# Patient Record
Sex: Male | Born: 1955 | Race: White | Hispanic: No | Marital: Married | Smoking: Never smoker
Health system: Southern US, Community
[De-identification: ages and names within clinical notes are randomized; demographics above are authoritative.]

## PROBLEM LIST (undated history)

## (undated) DIAGNOSIS — E291 Testicular hypofunction: Secondary | ICD-10-CM

## (undated) DIAGNOSIS — N529 Male erectile dysfunction, unspecified: Secondary | ICD-10-CM

## (undated) DIAGNOSIS — E785 Hyperlipidemia, unspecified: Secondary | ICD-10-CM

## (undated) DIAGNOSIS — E559 Vitamin D deficiency, unspecified: Secondary | ICD-10-CM

## (undated) DIAGNOSIS — J302 Other seasonal allergic rhinitis: Secondary | ICD-10-CM

## (undated) DIAGNOSIS — J4540 Moderate persistent asthma, uncomplicated: Secondary | ICD-10-CM

## (undated) DIAGNOSIS — G4733 Obstructive sleep apnea (adult) (pediatric): Secondary | ICD-10-CM

## (undated) DIAGNOSIS — K219 Gastro-esophageal reflux disease without esophagitis: Secondary | ICD-10-CM

## (undated) DIAGNOSIS — Z87438 Personal history of other diseases of male genital organs: Secondary | ICD-10-CM

## (undated) DIAGNOSIS — F32A Depression, unspecified: Secondary | ICD-10-CM

## (undated) DIAGNOSIS — M1990 Unspecified osteoarthritis, unspecified site: Secondary | ICD-10-CM

## (undated) DIAGNOSIS — M25511 Pain in right shoulder: Secondary | ICD-10-CM

## (undated) DIAGNOSIS — F329 Major depressive disorder, single episode, unspecified: Secondary | ICD-10-CM

## (undated) DIAGNOSIS — F411 Generalized anxiety disorder: Secondary | ICD-10-CM

## (undated) HISTORY — PX: KNEE ARTHROSCOPY: SUR90

## (undated) HISTORY — PX: ROTATOR CUFF REPAIR: SHX139

## (undated) HISTORY — PX: HEMORRHOID SURGERY: SHX153

## (undated) HISTORY — PX: CARDIAC CATHETERIZATION: SHX172

## (undated) HISTORY — PX: REPLACEMENT UNICONDYLAR JOINT KNEE: SUR1227

---
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
## 1983-09-26 HISTORY — PX: FINGER FRACTURE SURGERY: SHX638

## 2000-02-11 ENCOUNTER — Emergency Department (HOSPITAL_COMMUNITY): Admission: EM | Admit: 2000-02-11 | Discharge: 2000-02-11 | Payer: Self-pay | Admitting: Emergency Medicine

## 2000-02-11 ENCOUNTER — Encounter: Payer: Self-pay | Admitting: Emergency Medicine

## 2009-01-24 ENCOUNTER — Emergency Department (HOSPITAL_COMMUNITY): Admission: EM | Admit: 2009-01-24 | Discharge: 2009-01-25 | Payer: Self-pay | Admitting: Emergency Medicine

## 2009-09-25 DIAGNOSIS — Z87438 Personal history of other diseases of male genital organs: Secondary | ICD-10-CM

## 2009-09-25 HISTORY — DX: Personal history of other diseases of male genital organs: Z87.438

## 2010-10-18 LAB — URINALYSIS, ROUTINE W REFLEX MICROSCOPIC
Bilirubin Urine: NEGATIVE
Ketones, ur: NEGATIVE mg/dL
Nitrite: NEGATIVE
Protein, ur: NEGATIVE mg/dL
Urobilinogen, UA: 0.2 mg/dL (ref 0.0–1.0)
pH: 5.5 (ref 5.0–8.0)

## 2010-10-18 LAB — DIFFERENTIAL
Basophils Absolute: 0.1 K/uL (ref 0.0–0.1)
Basophils Relative: 1 % (ref 0–1)
Eosinophils Absolute: 0.1 K/uL (ref 0.0–0.7)
Eosinophils Relative: 2 % (ref 0–5)
Lymphocytes Relative: 24 % (ref 12–46)
Lymphs Abs: 1.8 K/uL (ref 0.7–4.0)
Monocytes Absolute: 0.4 K/uL (ref 0.1–1.0)
Monocytes Relative: 5 % (ref 3–12)
Neutro Abs: 5.4 K/uL (ref 1.7–7.7)
Neutrophils Relative %: 69 % (ref 43–77)

## 2010-10-18 LAB — BASIC METABOLIC PANEL WITH GFR
BUN: 11 mg/dL (ref 6–23)
CO2: 29 meq/L (ref 19–32)
Calcium: 9.6 mg/dL (ref 8.4–10.5)
Chloride: 102 meq/L (ref 96–112)
Creatinine, Ser: 1.09 mg/dL (ref 0.4–1.5)
GFR calc non Af Amer: >60 mL/min
Glucose, Bld: 122 mg/dL — ABNORMAL HIGH (ref 70–99)
Potassium: 3.6 meq/L (ref 3.5–5.1)
Sodium: 138 meq/L (ref 135–145)

## 2010-10-18 LAB — CBC
MCH: 31.1 pg (ref 26.0–34.0)
MCHC: 35.5 g/dL (ref 30.0–36.0)
MCV: 87.8 fL (ref 78.0–100.0)
Platelets: 256 10*3/uL (ref 150–400)
RDW: 12.3 % (ref 11.5–15.5)

## 2010-10-18 LAB — PROTIME-INR
INR: 0.96 (ref 0.00–1.49)
Prothrombin Time: 13 seconds (ref 11.6–15.2)

## 2010-10-18 LAB — APTT: aPTT: 36 s (ref 24–37)

## 2010-10-24 ENCOUNTER — Inpatient Hospital Stay (HOSPITAL_COMMUNITY)
Admission: RE | Admit: 2010-10-24 | Discharge: 2010-10-25 | Disposition: A | Discharge status: Home / Self Care | Payer: Self-pay | Source: Home / Self Care | Attending: Orthopedic Surgery | Admitting: Orthopedic Surgery

## 2010-10-24 LAB — TYPE AND SCREEN: ABO/RH(D): A NEG

## 2010-10-24 LAB — ABO/RH: ABO/RH(D): A NEG

## 2010-10-25 LAB — CBC
HCT: 35 % — ABNORMAL LOW (ref 39.0–52.0)
Hemoglobin: 11.9 g/dL — ABNORMAL LOW (ref 13.0–17.0)
MCH: 30.7 pg (ref 26.0–34.0)
MCHC: 34 g/dL (ref 30.0–36.0)
MCV: 90.2 fL (ref 78.0–100.0)
Platelets: 184 10*3/uL (ref 150–400)
RBC: 3.88 MIL/uL — ABNORMAL LOW (ref 4.22–5.81)
RDW: 12.5 % (ref 11.5–15.5)
WBC: 7.3 10*3/uL (ref 4.0–10.5)

## 2010-10-25 LAB — BASIC METABOLIC PANEL
BUN: 9 mg/dL (ref 6–23)
CO2: 28 mEq/L (ref 19–32)
Calcium: 8.2 mg/dL — ABNORMAL LOW (ref 8.4–10.5)
Chloride: 108 mEq/L (ref 96–112)
Creatinine, Ser: 1.15 mg/dL (ref 0.4–1.5)
GFR calc Af Amer: >60 mL/min (ref >60)
GFR calc non Af Amer: >60 mL/min (ref >60)
Glucose, Bld: 130 mg/dL — ABNORMAL HIGH (ref 70–99)
Potassium: 4 mEq/L (ref 3.5–5.1)
Sodium: 140 mEq/L (ref 135–145)

## 2010-10-27 NOTE — Op Note (Signed)
Eric Travis, Eric Travis                ACCOUNT NO.:  [id number]  MEDICAL RECORD NO.:  [id number]          PATIENT TYPE:  INP  LOCATION:  0009                         FACILITY:  [facility]  PHYSICIAN:  Eric Frankel. Charlann Travis, M.D.  DATE OF BIRTH:  May 10, 1956  DATE OF PROCEDURE:  10/24/2010 DATE OF DISCHARGE:                              OPERATIVE REPORT   PREOPERATIVE DIAGNOSIS:  Left knee medial compartment osteoarthritis.  POSTOPERATIVE DIAGNOSIS:  Left knee medial compartment osteoarthritis.  PROCEDURE:  Left knee unicompartmental knee replacement utilizing a Biomet Oxford partial knee, size large femur B tibial tray, size 5 insert to match the femur.  SURGEON:  Eric Frankel. Charlann Travis, M.D.  ASSISTANT:  Eric Shi. Webb Silversmith, RN  ANESTHESIA:  Spinal.  COMPLICATIONS:  None.  SPECIMENS:  None.  DRAINS:  One Hemovac.  TOURNIQUET TIME:  39 minutes at 250 mmHg.  INDICATIONS FOR THE PROCEDURE:  Eric Travis is 55 year old gentleman who presented to the office for evaluation of progressive left knee medial compartment degenerative changes that was failing conservative measures.  Unfortunately, due to time and socially from work standpoint, he had a hard time finding time to get surgery done; however, his knee pain progressed to the point where he decided he needed to take time to do this.  We discussed the risk of infection, DVT, component failure, need for revision surgery.  Discussed the chance of having to revise him to a total knee replacement at some point.  Risks and benefits were reviewed and consent was obtained for the benefit of pain relief.  PROCEDURE IN DETAIL:  The patient was brought to operative theater. Once adequate anesthesia, preoperative antibiotics and Ancef administered, the patient was positioned supine with tourniquet placed. The nonoperative leg was flexed over the end of the table with the leg holder.  The patient was positioned to take tension off his back to keep his  head in a neutral position.  The left lower extremity was then prepped and draped in a sterile fashion.  Time-out was performed identifying the patient, planned procedure and the extremity.  Leg was exsanguinated, tourniquet elevated to 250 mmHg.  Paramidline incision was made from the proximal pole of the patella to the tubercle.  Soft tissue planes created.  A partial median arthrotomy was then made following initial synovectomy, debridement and partial meniscectomy.  Attention was first directed to the tibia.  Using the extramedullary guide, initial resection was made. This gap between the tibial femoral space was tight still yet, so I removed 3 more millimeters of bone by dropping the jig down 1 hole.  At this point, the cut surface even though I was lateral along the tibial spine seemed to be best fit with a size B tibial tray, particularly removing the medial osteophytes.  At this point, I placed a three-feeler gauge.  The four-feeler gauge had appropriate tension at this point.  At this point, the femoral canal was opened with a drill and then the awl.  Intramedullary rod was passed.  With a three-feeler gauge in place, the posterior guide for the cutting block was positioned in the center portion of medial  femoral condyle.  The true drills were made. The posterior cutting block was then tamped into position and an oscillating saw used to make the posterior cut.  At this point, based on the flexion assessment, I used a 4 miller and milled down the distal femur.  Remaining bone was removed.  At this point, I was able to get a good look at the posterior aspect of the knee.  I was able to remove remaining meniscus.  At this point, the large femur was placed, B tibial tray.  At this point, I felt like I was going to be between 4 and 5 based on the tension in flexion and 20 degrees of extension.  Given these parameters, a trial femoral component was removed.  The tibial tray was  pinned into position.  The trough for the keel was made with a reciprocating saw first and then the osteotome.  Trial reduction was now carried out with the lollipop retractor, large femur and the keeled tibial tray.  Again, I was between 4 and 5 and was going to make this determination following cementing.  Trial components were removed.  The sclerotic bone was drilled with a holes.  Knee was irrigated and cement mixed.  Final component was opened.  Final components were cemented in a two-stage technique with the tibia component cemented first and trial femur placed.  The knee was brought to extension with a five-feeler gauge and the knee held at 45 degrees of flexion to allow for the cement to cure.  Following a minute and half, the femoral component was cemented into position and the knee held at 45 degrees of flexion with the five-feeler gauge in place until cement had cured.  While this was going on, the synovial capsule junction of the knee was injected with 0.25% Marcaine with epinephrine and 1 cc of Toradol, total of 31 cc.  Once the cement had cured and I was satisfied, I was unable to visualize any remaining cement in the knee.  I retrialed.  I ended up choosing the 5 insert based on the trial reduction lollipop retractors.  The 5 insert was chosen to match the large femur.  The insert was then snapped into place.  The knee was reirrigated.  Tourniquet had been let down at 39 minutes.  A medium Hemovac drain was placed deep.  The extensor mechanism was then reapproximated using #1 Vicryl with the knee in flexion.  The remainder of the wound was closed with 2-0 Vicryl and running 4-0 Monocryl.  The knee was cleaned, dried and dressed sterilely using Dermabond and Aquacel dressing.  The drain site was dressed separately.  He was then placed into a sterile Ace wrap, brought to recovery room in stable condition tolerating the procedure well.     Eric Travis,  M.D.     MDO/MEDQ  D:  10/24/2010  T:  10/24/2010  Job:  [id number]  Electronically Signed by Eric Travis M.D. on 10/27/2010 11:46:54 AM

## 2010-11-06 NOTE — Discharge Summary (Signed)
  NAMEABDUL, Eric Travis NO.:  [id number]  MEDICAL RECORD NO.:  [id number]          PATIENT TYPE:  INP  LOCATION:  1618                         FACILITY:  [facility]  PHYSICIAN:  Madlyn Frankel. Charlann Boxer, M.D.  DATE OF BIRTH:  1956-09-21  DATE OF ADMISSION:  10/24/2010 DATE OF DISCHARGE:                              DISCHARGE SUMMARY   BRIEF HISTORY:  The patient is seen for conservative treatment for left knee pain for some time through worker's compensation.  He had been treated by Dr. Thomasena Edis in our practice for other things and decided to proceed with left uni-knee arthroplasty with Dr. Charlann Boxer.  ADMITTING DIAGNOSIS:  Left knee medial compartment osteoarthritis.  DISCHARGE DIAGNOSIS:  Left knee medial compartment osteoarthritis.  He is also significant for some impaired hearing with surgical correction, he wears reading glasses.  He has history of hemorrhoids and left knee pain, some joint weakness and tinnitus from time to time.  PAST SURGICAL HISTORY:  Knee surgeries, shoulder surgeries he has had with Dr. Thomasena Edis.  He has had 1 hand surgery in the past and hemorrhoid surgery.  LABORATORY DATA:  His labs have been stable.  This morning his hematocrit and hemoglobin were 11.9 and 35.0.  Sodium was 140, his potassium was 4.0.  His BUN and creatinine were 9 and 1.15.  His blood sugar is 130.  He is afebrile.  His vital signs are stable.  CONDITION ON DISCHARGE:  His discharge condition is good.  He will follow up with Dr. Charlann Boxer in 2 weeks.  Discharge instructions were given.  He has Aquacel dressing in place.  He will have home health physical therapy for 2 weeks.  He will be able to shower with that dressing and it needs to come off in 8 days.  DISCHARGE MEDICATIONS:  Discharge medications are as follows: 1. Acetaminophen 325 mg 2 every 4 hours as needed. 2. Colace 100 mg twice daily as needed. 3. Ferrous sulfate 325 mg 3 times a day. 4. Hydrocodone 7.5/325 one to  two q.4-6 hours p.r.n. pain. 5. Robaxin 500 mg every 6 hours as needed. 6. Percocet 5/325 one to two every 4 to 6 hours as needed, not with     hydrocodone. 7. MiraLax 17 g a day as needed. 8. Xarelto 10 mg a day for 10 days. 9. Calcium and vitamin D. 10.Multivitamins daily.     Russell L. Webb Silversmith, RN   ______________________________ Madlyn Frankel Charlann Boxer, M.D.    RLW/MEDQ  D:  10/25/2010  T:  10/25/2010  Job:  [id number]  Electronically Signed by Lauree Chandler NP-C on 11/02/2010 09:42:21 AM Electronically Signed by Durene Romans M.D. on 11/06/2010 09:16:53 AM

## 2010-11-06 NOTE — H&P (Signed)
Eric Travis, SPEAKMAN                ACCOUNT NO.:  [id number]  MEDICAL RECORD NO.:  [id number]          PATIENT TYPE:  INP  LOCATION:  NA                           FACILITY:  [facility]  PHYSICIAN:  Madlyn Frankel. Charlann Boxer, M.D.  DATE OF BIRTH:  1956-07-18  DATE OF ADMISSION: DATE OF DISCHARGE:                             HISTORY & PHYSICAL   BRIEF HISTORY:  This is a patient who had been seen here conservatively for left knee pain for sometime.  He has been treated in this practice by Dr. Thomasena Edis for shoulder issues in the past and has decided to proceed with the left uni knee arthroplasty due to failed conservative treatment.  ADMITTING DIAGNOSIS:  Left knee medial compartment osteoarthritis.  PAST MEDICAL HISTORY:  Significant for an impaired hearing problem, this being surgically corrected in the near future.  He does wear reading glasses.  He has got history of hemorrhoids and he has a left knee pain and muscular weakness, joint pain.  He has some tinnitus from time to time.  MEDICATIONS:  The patient takes no medicines.  ALLERGIES:  Has no medicine allergies.  SURGICAL HISTORY:  Had three knee surgeries and ear surgery, 1 hand surgery,  6 shoulder surgeries, and hemorrhoid surgery.  SOCIAL HISTORY:  He is married.  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  Has no history of tobacco, alcohol, or street drug abuse.  He has 3 children.  DISCHARGE DISPOSITION PLAN:  Per home.  FAMILY HISTORY:  His father is 60.  His mother died at age 65.  Has no siblings.  REVIEW OF SYSTEMS:  Notable for those difficulties described in history of present illness, past medical history; however, his review of systems is otherwise unremarkable.  PHYSICAL EXAMINATION:  VITAL SIGNS:  The patient is 5 feet and 10 inches, 178 pounds, blood pressure is 118/80s, respirations 20s, pulse is 80. GENERAL:  Health is good. HEENT:  Shows him to be normocephalic.  He does have impaired hearing in the right ear.  He  wears reading glasses. NECK:  Unremarkable. CHEST:  Clear to auscultation bilaterally. HEART:  Has S1 and S2.  No murmurs, rubs, or gallops. ABDOMEN:  Soft, nondistended.  He does have a history of hemorrhoids. GU AND GI:  Otherwise unremarkable. EXTREMITIES:  Exam shows osteoarthritis of the medial compartment of the left knee with some swelling. DERMATOLOGICALLY:  He is intact. NEUROLOGICALLY:  He is intact.  LABORATORY DATA:  EKG and chest x-ray are pending to [facility].  IMPRESSION:  Left medial knee compartment, osteoarthritis.  PLAN:  He will be admitted on the January 30 for a left uni knee arthroplasty.  His questions were encouraged and answered.  His discharge medications including Robaxin, MiraLax, Colace, iron are relatively given to him today.  His pain medicine will be given to him at discharge.     Russell L. Webb Silversmith, RN   ______________________________ Madlyn Frankel Charlann Boxer, M.D.    RLW/MEDQ  D:  10/17/2010  T:  10/17/2010  Job:  [id number]  Electronically Signed by Lauree Chandler NP-C on 11/02/2010 09:42:24 AM Electronically Signed by Durene Romans M.D. on  11/06/2010 09:16:50 AM

## 2014-04-23 DIAGNOSIS — F4323 Adjustment disorder with mixed anxiety and depressed mood: Secondary | ICD-10-CM | POA: Insufficient documentation

## 2016-06-30 DIAGNOSIS — R0683 Snoring: Secondary | ICD-10-CM | POA: Insufficient documentation

## 2018-01-04 DIAGNOSIS — Z5181 Encounter for therapeutic drug level monitoring: Secondary | ICD-10-CM | POA: Insufficient documentation

## 2018-11-05 ENCOUNTER — Other Ambulatory Visit: Payer: Self-pay | Admitting: Specialist

## 2018-11-05 DIAGNOSIS — M75121 Complete rotator cuff tear or rupture of right shoulder, not specified as traumatic: Secondary | ICD-10-CM

## 2018-11-18 ENCOUNTER — Ambulatory Visit
Admission: RE | Admit: 2018-11-18 | Discharge: 2018-11-18 | Disposition: A | Discharge status: Home / Self Care | Payer: BLUE CROSS/BLUE SHIELD | Source: Ambulatory Visit | Attending: Specialist | Admitting: Specialist

## 2018-11-18 DIAGNOSIS — M75121 Complete rotator cuff tear or rupture of right shoulder, not specified as traumatic: Secondary | ICD-10-CM

## 2018-11-18 MED ORDER — IOPAMIDOL (ISOVUE-M 200) INJECTION 41%
13.0000 mL | Freq: Once | INTRAMUSCULAR | Status: AC
  Administered 2018-11-18: 13 mL via INTRA_ARTICULAR

## 2018-11-21 DIAGNOSIS — D751 Secondary polycythemia: Secondary | ICD-10-CM | POA: Insufficient documentation

## 2019-04-18 ENCOUNTER — Other Ambulatory Visit (HOSPITAL_COMMUNITY)
Admission: RE | Admit: 2019-04-18 | Discharge: 2019-04-18 | Disposition: A | Discharge status: Home / Self Care | Payer: BC Managed Care – PPO | Source: Ambulatory Visit | Attending: Specialist | Admitting: Specialist

## 2019-04-18 DIAGNOSIS — Z1159 Encounter for screening for other viral diseases: Secondary | ICD-10-CM | POA: Diagnosis present

## 2019-04-19 LAB — SARS CORONAVIRUS 2 (TAT 6-24 HRS): SARS Coronavirus 2: NEGATIVE

## 2019-04-21 ENCOUNTER — Encounter (HOSPITAL_BASED_OUTPATIENT_CLINIC_OR_DEPARTMENT_OTHER): Payer: Self-pay | Admitting: *Deleted

## 2019-04-21 ENCOUNTER — Other Ambulatory Visit: Payer: Self-pay

## 2019-04-21 DIAGNOSIS — M25511 Pain in right shoulder: Secondary | ICD-10-CM | POA: Insufficient documentation

## 2019-04-21 NOTE — Progress Notes (Signed)
Spoke w/ pt via phone for pre-op interview.  Npo after mn.  Arrive at 0530.  Pt had covid test done 04-18-2019.  Will take prilosec am dos w/ sips of water.  Asked pt to bring inhaler dos.

## 2019-04-22 ENCOUNTER — Ambulatory Visit (HOSPITAL_BASED_OUTPATIENT_CLINIC_OR_DEPARTMENT_OTHER)
Admission: RE | Admit: 2019-04-22 | Discharge: 2019-04-22 | Disposition: A | Discharge status: Home / Self Care | Payer: BC Managed Care – PPO | Attending: Specialist | Admitting: Specialist

## 2019-04-22 ENCOUNTER — Encounter (HOSPITAL_BASED_OUTPATIENT_CLINIC_OR_DEPARTMENT_OTHER)
Admission: RE | Disposition: A | Discharge status: Home / Self Care | Payer: Self-pay | Source: Home / Self Care | Attending: Specialist

## 2019-04-22 ENCOUNTER — Ambulatory Visit (HOSPITAL_BASED_OUTPATIENT_CLINIC_OR_DEPARTMENT_OTHER): Payer: BC Managed Care – PPO | Admitting: Anesthesiology

## 2019-04-22 ENCOUNTER — Other Ambulatory Visit: Payer: Self-pay

## 2019-04-22 ENCOUNTER — Encounter (HOSPITAL_BASED_OUTPATIENT_CLINIC_OR_DEPARTMENT_OTHER): Payer: Self-pay

## 2019-04-22 DIAGNOSIS — E291 Testicular hypofunction: Secondary | ICD-10-CM | POA: Insufficient documentation

## 2019-04-22 DIAGNOSIS — F411 Generalized anxiety disorder: Secondary | ICD-10-CM | POA: Diagnosis not present

## 2019-04-22 DIAGNOSIS — M67813 Other specified disorders of tendon, right shoulder: Secondary | ICD-10-CM | POA: Diagnosis not present

## 2019-04-22 DIAGNOSIS — J4540 Moderate persistent asthma, uncomplicated: Secondary | ICD-10-CM | POA: Insufficient documentation

## 2019-04-22 DIAGNOSIS — G4733 Obstructive sleep apnea (adult) (pediatric): Secondary | ICD-10-CM | POA: Diagnosis not present

## 2019-04-22 DIAGNOSIS — M75121 Complete rotator cuff tear or rupture of right shoulder, not specified as traumatic: Secondary | ICD-10-CM | POA: Diagnosis present

## 2019-04-22 DIAGNOSIS — Z79899 Other long term (current) drug therapy: Secondary | ICD-10-CM | POA: Insufficient documentation

## 2019-04-22 DIAGNOSIS — E559 Vitamin D deficiency, unspecified: Secondary | ICD-10-CM | POA: Insufficient documentation

## 2019-04-22 DIAGNOSIS — K219 Gastro-esophageal reflux disease without esophagitis: Secondary | ICD-10-CM | POA: Insufficient documentation

## 2019-04-22 DIAGNOSIS — Z96653 Presence of artificial knee joint, bilateral: Secondary | ICD-10-CM | POA: Insufficient documentation

## 2019-04-22 DIAGNOSIS — F329 Major depressive disorder, single episode, unspecified: Secondary | ICD-10-CM | POA: Insufficient documentation

## 2019-04-22 HISTORY — DX: Hyperlipidemia, unspecified: E78.5

## 2019-04-22 HISTORY — DX: Obstructive sleep apnea (adult) (pediatric): G47.33

## 2019-04-22 HISTORY — DX: Male erectile dysfunction, unspecified: N52.9

## 2019-04-22 HISTORY — DX: Vitamin D deficiency, unspecified: E55.9

## 2019-04-22 HISTORY — DX: Depression, unspecified: F32.A

## 2019-04-22 HISTORY — DX: Gastro-esophageal reflux disease without esophagitis: K21.9

## 2019-04-22 HISTORY — DX: Other seasonal allergic rhinitis: J30.2

## 2019-04-22 HISTORY — DX: Testicular hypofunction: E29.1

## 2019-04-22 HISTORY — DX: Pain in right shoulder: M25.511

## 2019-04-22 HISTORY — DX: Generalized anxiety disorder: F41.1

## 2019-04-22 HISTORY — DX: Moderate persistent asthma, uncomplicated: J45.40

## 2019-04-22 HISTORY — PX: SHOULDER ARTHROSCOPY WITH ROTATOR CUFF REPAIR: SHX5685

## 2019-04-22 SURGERY — ARTHROSCOPY, SHOULDER, WITH ROTATOR CUFF REPAIR
Anesthesia: General | Site: Shoulder | Laterality: Right

## 2019-04-22 MED ORDER — PROPOFOL 10 MG/ML IV BOLUS
INTRAVENOUS | Status: DC | PRN
  Administered 2019-04-22: 180 mg via INTRAVENOUS

## 2019-04-22 MED ORDER — BUPIVACAINE-EPINEPHRINE (PF) 0.5% -1:200000 IJ SOLN
INTRAMUSCULAR | Status: DC | PRN
  Administered 2019-04-22: 15 mL via PERINEURAL

## 2019-04-22 MED ORDER — DEXAMETHASONE SODIUM PHOSPHATE 10 MG/ML IJ SOLN
INTRAMUSCULAR | Status: AC
  Filled 2019-04-22: qty 1

## 2019-04-22 MED ORDER — SODIUM CHLORIDE 0.9 % IR SOLN
Status: DC | PRN
  Administered 2019-04-22: 6000 mL

## 2019-04-22 MED ORDER — FENTANYL CITRATE (PF) 100 MCG/2ML IJ SOLN
INTRAMUSCULAR | Status: DC | PRN
  Administered 2019-04-22: 50 ug via INTRAVENOUS

## 2019-04-22 MED ORDER — FENTANYL CITRATE (PF) 100 MCG/2ML IJ SOLN
INTRAMUSCULAR | Status: AC
  Filled 2019-04-22: qty 2

## 2019-04-22 MED ORDER — MIDAZOLAM HCL 2 MG/2ML IJ SOLN
INTRAMUSCULAR | Status: AC
  Filled 2019-04-22: qty 2

## 2019-04-22 MED ORDER — EPINEPHRINE PF 1 MG/ML IJ SOLN
INTRAMUSCULAR | Status: DC | PRN
  Administered 2019-04-22: .625 mg

## 2019-04-22 MED ORDER — POVIDONE-IODINE 7.5 % EX SOLN
Freq: Once | CUTANEOUS | Status: DC
  Filled 2019-04-22: qty 118

## 2019-04-22 MED ORDER — LIDOCAINE HCL (CARDIAC) PF 100 MG/5ML IV SOSY
PREFILLED_SYRINGE | INTRAVENOUS | Status: DC | PRN
  Administered 2019-04-22: 80 mg via INTRAVENOUS

## 2019-04-22 MED ORDER — OXYCODONE HCL 5 MG PO TABS
5.0000 mg | ORAL_TABLET | Freq: Once | ORAL | Status: DC | PRN
  Filled 2019-04-22: qty 1

## 2019-04-22 MED ORDER — FENTANYL CITRATE (PF) 250 MCG/5ML IJ SOLN
INTRAMUSCULAR | Status: AC
  Filled 2019-04-22: qty 5

## 2019-04-22 MED ORDER — ONDANSETRON HCL 4 MG/2ML IJ SOLN
INTRAMUSCULAR | Status: AC
  Filled 2019-04-22: qty 2

## 2019-04-22 MED ORDER — ONDANSETRON HCL 4 MG/2ML IJ SOLN
INTRAMUSCULAR | Status: DC | PRN
  Administered 2019-04-22: 4 mg via INTRAVENOUS

## 2019-04-22 MED ORDER — DEXAMETHASONE SODIUM PHOSPHATE 4 MG/ML IJ SOLN
INTRAMUSCULAR | Status: DC | PRN
  Administered 2019-04-22: 5 mg via INTRAVENOUS

## 2019-04-22 MED ORDER — MIDAZOLAM HCL 2 MG/2ML IJ SOLN
2.0000 mg | Freq: Once | INTRAMUSCULAR | Status: AC
  Administered 2019-04-22: 2 mg via INTRAVENOUS
  Filled 2019-04-22: qty 2

## 2019-04-22 MED ORDER — CEFAZOLIN SODIUM-DEXTROSE 2-4 GM/100ML-% IV SOLN
INTRAVENOUS | Status: AC
  Filled 2019-04-22: qty 100

## 2019-04-22 MED ORDER — PHENYLEPHRINE 40 MCG/ML (10ML) SYRINGE FOR IV PUSH (FOR BLOOD PRESSURE SUPPORT)
PREFILLED_SYRINGE | INTRAVENOUS | Status: AC
  Filled 2019-04-22: qty 10

## 2019-04-22 MED ORDER — EPHEDRINE 5 MG/ML INJ
INTRAVENOUS | Status: AC
  Filled 2019-04-22: qty 10

## 2019-04-22 MED ORDER — LIDOCAINE 2% (20 MG/ML) 5 ML SYRINGE
INTRAMUSCULAR | Status: AC
  Filled 2019-04-22: qty 5

## 2019-04-22 MED ORDER — FENTANYL CITRATE (PF) 100 MCG/2ML IJ SOLN
25.0000 ug | INTRAMUSCULAR | Status: DC | PRN
  Filled 2019-04-22: qty 1

## 2019-04-22 MED ORDER — EPHEDRINE SULFATE 50 MG/ML IJ SOLN
INTRAMUSCULAR | Status: DC | PRN
  Administered 2019-04-22 (×3): 10 mg via INTRAVENOUS

## 2019-04-22 MED ORDER — ROCURONIUM BROMIDE 10 MG/ML (PF) SYRINGE
PREFILLED_SYRINGE | INTRAVENOUS | Status: AC
  Filled 2019-04-22: qty 10

## 2019-04-22 MED ORDER — ONDANSETRON HCL 4 MG/2ML IJ SOLN
4.0000 mg | Freq: Four times a day (QID) | INTRAMUSCULAR | Status: DC | PRN
  Filled 2019-04-22: qty 2

## 2019-04-22 MED ORDER — SODIUM CHLORIDE (PF) 0.9 % IJ SOLN
INTRAMUSCULAR | Status: DC | PRN
  Administered 2019-04-22: 4.375 mL

## 2019-04-22 MED ORDER — MIDAZOLAM HCL 5 MG/5ML IJ SOLN
INTRAMUSCULAR | Status: DC | PRN
  Administered 2019-04-22: 1 mg via INTRAVENOUS

## 2019-04-22 MED ORDER — FENTANYL CITRATE (PF) 100 MCG/2ML IJ SOLN
50.0000 ug | Freq: Once | INTRAMUSCULAR | Status: AC
  Administered 2019-04-22: 07:00:00 50 ug via INTRAVENOUS
  Filled 2019-04-22: qty 1

## 2019-04-22 MED ORDER — BUPIVACAINE LIPOSOME 1.3 % IJ SUSP
INTRAMUSCULAR | Status: DC | PRN
  Administered 2019-04-22: 10 mL via PERINEURAL

## 2019-04-22 MED ORDER — LACTATED RINGERS IV SOLN
INTRAVENOUS | Status: DC
  Administered 2019-04-22: 09:00:00 via INTRAVENOUS
  Administered 2019-04-22: 50 mL/h via INTRAVENOUS
  Filled 2019-04-22: qty 1000

## 2019-04-22 MED ORDER — SUCCINYLCHOLINE CHLORIDE 20 MG/ML IJ SOLN
INTRAMUSCULAR | Status: DC | PRN
  Administered 2019-04-22: 120 mg via INTRAVENOUS

## 2019-04-22 MED ORDER — PHENYLEPHRINE HCL (PRESSORS) 10 MG/ML IV SOLN
INTRAVENOUS | Status: DC | PRN
  Administered 2019-04-22 (×2): 80 ug via INTRAVENOUS

## 2019-04-22 MED ORDER — PROPOFOL 10 MG/ML IV BOLUS
INTRAVENOUS | Status: AC
  Filled 2019-04-22: qty 20

## 2019-04-22 MED ORDER — OXYCODONE HCL 5 MG/5ML PO SOLN
5.0000 mg | Freq: Once | ORAL | Status: DC | PRN
  Filled 2019-04-22: qty 5

## 2019-04-22 MED ORDER — CEFAZOLIN SODIUM-DEXTROSE 2-4 GM/100ML-% IV SOLN
2.0000 g | INTRAVENOUS | Status: AC
  Administered 2019-04-22: 2 g via INTRAVENOUS
  Filled 2019-04-22: qty 100

## 2019-04-22 SURGICAL SUPPLY — 87 items
ANCH SUT SWLK 19.1X4.75 VT (Anchor) ×2 IMPLANT
ANCHOR PEEK 4.75X19.1 SWLK C (Anchor) ×4 IMPLANT
BLADE EXCALIBUR 4.0MM X 13CM (MISCELLANEOUS) ×1
BLADE EXCALIBUR 4.0X13 (MISCELLANEOUS) ×2 IMPLANT
BLADE EXCALIBUR 5.0X13 (MISCELLANEOUS) ×1 IMPLANT
BLADE SURG 11 STRL SS (BLADE) ×3 IMPLANT
BLADE SURG 15 STRL LF DISP TIS (BLADE) ×1 IMPLANT
BLADE SURG 15 STRL SS (BLADE) ×3
BURR CLEARCUT OVAL 5.5X13 (MISCELLANEOUS) ×1 IMPLANT
BURR OVAL 12 FL 5.5MM X 13CM (MISCELLANEOUS) ×1
BURR OVAL 12 FL 5.5X13 (MISCELLANEOUS) ×1
BURR OVAL 8 FLU 5.0MM X 13CM (MISCELLANEOUS) ×1
BURR OVAL 8 FLU 5.0X13 (MISCELLANEOUS) ×2 IMPLANT
CANNULA 5.75X7 CRYSTAL CLEAR (CANNULA) ×2 IMPLANT
CANNULA 5.75X71 LONG (CANNULA) IMPLANT
CANNULA TWIST IN 8.25X7CM (CANNULA) ×2 IMPLANT
CONNECTOR 5 IN 1 STRAIGHT STRL (MISCELLANEOUS) ×3 IMPLANT
COVER WAND RF STERILE (DRAPES) ×3 IMPLANT
DECANTER SPIKE VIAL GLASS SM (MISCELLANEOUS) ×3 IMPLANT
DRAPE ORTHO SPLIT 77X108 STRL (DRAPES) ×6
DRAPE POUCH INSTRU U-SHP 10X18 (DRAPES) ×3 IMPLANT
DRAPE SHEET LG 3/4 BI-LAMINATE (DRAPES) ×3 IMPLANT
DRAPE STERI 35X30 U-POUCH (DRAPES) ×3 IMPLANT
DRAPE SURG 17X23 STRL (DRAPES) ×3 IMPLANT
DRAPE SURG ORHT 6 SPLT 77X108 (DRAPES) ×2 IMPLANT
DRAPE U-SHAPE 47X51 STRL (DRAPES) ×3 IMPLANT
DURAPREP 26ML APPLICATOR (WOUND CARE) ×5 IMPLANT
DW OUTFLOW CASSETTE/TUBE SET (MISCELLANEOUS) ×3 IMPLANT
ELECT MENISCUS 165MM 90D (ELECTRODE) IMPLANT
ELECT REM PT RETURN 9FT ADLT (ELECTROSURGICAL) ×3
ELECTRODE REM PT RTRN 9FT ADLT (ELECTROSURGICAL) ×1 IMPLANT
EXCALIBUR 3.8MM X 13CM (MISCELLANEOUS) ×3 IMPLANT
FIBER TAPE 2MM (SUTURE) ×3 IMPLANT
FIBERSTICK 2 (SUTURE) IMPLANT
GAUZE SPONGE 4X4 12PLY STRL (GAUZE/BANDAGES/DRESSINGS) ×3 IMPLANT
GAUZE XEROFORM 1X8 LF (GAUZE/BANDAGES/DRESSINGS) ×3 IMPLANT
GLOVE BIO SURGEON STRL SZ 6.5 (GLOVE) ×1 IMPLANT
GLOVE BIO SURGEON STRL SZ8 (GLOVE) ×3 IMPLANT
GLOVE BIO SURGEONS STRL SZ 6.5 (GLOVE) ×1
GLOVE INDICATOR 8.0 STRL GRN (GLOVE) ×3 IMPLANT
GOWN STRL REUS W/ TWL LRG LVL3 (GOWN DISPOSABLE) IMPLANT
GOWN STRL REUS W/TWL LRG LVL3 (GOWN DISPOSABLE) ×9 IMPLANT
GOWN STRL REUS W/TWL XL LVL3 (GOWN DISPOSABLE) ×6 IMPLANT
KIT TURNOVER CYSTO (KITS) ×3 IMPLANT
LASSO SUT 90 DEGREE (SUTURE) IMPLANT
MANIFOLD NEPTUNE II (INSTRUMENTS) ×3 IMPLANT
NDL 1/2 CIR CATGUT .05X1.09 (NEEDLE) IMPLANT
NDL SCORPION MULTI FIRE (NEEDLE) IMPLANT
NEEDLE 1/2 CIR CATGUT .05X1.09 (NEEDLE) IMPLANT
NEEDLE HYPO 22GX1.5 SAFETY (NEEDLE) ×3 IMPLANT
NEEDLE SCORPION MULTI FIRE (NEEDLE) ×3 IMPLANT
NS IRRIG 500ML POUR BTL (IV SOLUTION) IMPLANT
PACK ARTHROSCOPY DSU (CUSTOM PROCEDURE TRAY) ×3 IMPLANT
PACK BASIN DAY SURGERY FS (CUSTOM PROCEDURE TRAY) ×3 IMPLANT
PAD ABD 8X10 STRL (GAUZE/BANDAGES/DRESSINGS) ×3 IMPLANT
PAD ARMBOARD 7.5X6 YLW CONV (MISCELLANEOUS) IMPLANT
PENCIL BUTTON HOLSTER BLD 10FT (ELECTRODE) IMPLANT
PORT APPOLLO RF 90DEGREE MULTI (SURGICAL WAND) ×3 IMPLANT
SLEEVE ARM SUSPENSION SYSTEM (MISCELLANEOUS) ×3 IMPLANT
SLING S3 LATERAL DISP (MISCELLANEOUS) IMPLANT
SLING ULTRA II L (ORTHOPEDIC SUPPLIES) ×3 IMPLANT
SLING ULTRA II S (ORTHOPEDIC SUPPLIES) ×2 IMPLANT
SPONGE LAP 4X18 RFD (DISPOSABLE) IMPLANT
SUT 2 FIBERLOOP 20 STRT BLUE (SUTURE)
SUT ETHILON 3 0 PS 1 (SUTURE) ×3 IMPLANT
SUT FIBERWIRE #2 38 T-5 BLUE (SUTURE)
SUT LASSO 45 DEGREE LEFT (SUTURE) IMPLANT
SUT LASSO 45D RIGHT (SUTURE) IMPLANT
SUT PDS AB 0 CT1 36 (SUTURE) IMPLANT
SUT TIGER TAPE 7 IN WHITE (SUTURE) ×2 IMPLANT
SUT VIC AB 0 CT1 36 (SUTURE) IMPLANT
SUT VIC AB 2-0 CT1 27 (SUTURE)
SUT VIC AB 2-0 CT1 TAPERPNT 27 (SUTURE) IMPLANT
SUTURE 2 FIBERLOOP 20 STRT BLU (SUTURE) IMPLANT
SUTURE FIBERWR #2 38 T-5 BLUE (SUTURE) IMPLANT
SUTURE TAPE 1.3 40 TPR END (SUTURE) ×2 IMPLANT
SUTURETAPE 1.3 40 TPR END (SUTURE) ×9
SYR CONTROL 10ML LL (SYRINGE) ×3 IMPLANT
SYR TB 1ML 27GX1/2 SAFE (SYRINGE) ×1 IMPLANT
SYR TB 1ML 27GX1/2 SAFETY (SYRINGE) ×3
TAPE FIBER 2MM 7IN #2 BLUE (SUTURE) IMPLANT
TOWEL OR 17X26 10 PK STRL BLUE (TOWEL DISPOSABLE) ×3 IMPLANT
TUBE CONNECTING 12'X1/4 (SUCTIONS) ×1
TUBE CONNECTING 12X1/4 (SUCTIONS) ×2 IMPLANT
TUBING ARTHROSCOPY IRRIG 16FT (MISCELLANEOUS) ×3 IMPLANT
TUBING REDEUCE PUMP W/CON 8IN (MISCELLANEOUS) ×3 IMPLANT
WATER STERILE IRR 500ML POUR (IV SOLUTION) ×3 IMPLANT

## 2019-04-22 NOTE — Transfer of Care (Signed)
Immediate Anesthesia Transfer of Care Note  Patient: Eric Travis  Procedure(s) Performed: SHOULDER ARTHROSCOPY WITH ROTATOR CUFF REPAIR AND ACROMIOPLASTY (Right Shoulder)  Patient Location: PACU  Anesthesia Type:General  Level of Consciousness: awake, alert  and oriented  Airway & Oxygen Therapy: Patient Spontanous Breathing and Patient connected to nasal cannula oxygen  Post-op Assessment: Report given to RN and Post -op Vital signs reviewed and stable  Post vital signs: Reviewed and stable  Last Vitals:  Vitals Value Taken Time  BP 123/75 04/22/19 0945  Temp 36.3 C 04/22/19 0929  Pulse 94 04/22/19 0946  Resp 18 04/22/19 0946  SpO2 93 % 04/22/19 0946  Vitals shown include unvalidated device data.  Last Pain:  Vitals:   04/22/19 0929  TempSrc:   PainSc: 0-No pain      Patients Stated Pain Goal: 3 (17/49/44 9675)  Complications: No apparent anesthesia complications

## 2019-04-22 NOTE — Anesthesia Procedure Notes (Signed)
Anesthesia Regional Block: Interscalene brachial plexus block   Pre-Anesthetic Checklist: ,, timeout performed, Correct Patient, Correct Site, Correct Laterality, Correct Procedure, Correct Position, site marked, Risks and benefits discussed,  Surgical consent,  Pre-op evaluation,  At surgeon's request and post-op pain management  Laterality: Right  Prep: chloraprep       Needles:  Injection technique: Single-shot  Needle Type: Echogenic Stimulator Needle     Needle Length: 5cm  Needle Gauge: 22     Additional Needles:   Procedures:, nerve stimulator,,,,,,,   Nerve Stimulator or Paresthesia:  Response: biceps flexion, 0.45 mA,   Additional Responses:   Narrative:  Start time: 04/22/2019 7:11 AM End time: 04/22/2019 7:18 AM Injection made incrementally with aspirations every 5 mL.  Performed by: Personally  Anesthesiologist: Albertha Ghee, MD  Additional Notes: Functioning IV was confirmed and monitors were applied.  A 38mm 22ga Arrow echogenic stimulator needle was used. Sterile prep and drape,hand hygiene and sterile gloves were used.  Negative aspiration and negative test dose prior to incremental administration of local anesthetic. The patient tolerated the procedure well.  Ultrasound guidance: relevent anatomy identified, needle position confirmed, local anesthetic spread visualized around nerve(s), vascular puncture avoided.  Image printed for medical record.

## 2019-04-22 NOTE — H&P (Signed)
Eric Travis is an 63 y.o. male.   Chief Complaint: My right shoulder hurts HPI: 63 year old male well-known to me with previous right shoulder surgery increasing right shoulder pain has been treated conservatively and has not provided significant relief.  MRI scan reveals recurrent rotator cuff tear desires surgical reconstruction.  Past Medical History:  Diagnosis Date  . Chronic depression   . Dyslipidemia   . ED (erectile dysfunction)   . GAD (generalized anxiety disorder)   . GERD (gastroesophageal reflux disease)   . History of acute prostatitis 2011  . Hypogonadism male   . Moderate persistent asthma    pulmonologist-  dr Shela Commonsj. Janee Mornthompson [facility] [place]  . OSA (obstructive sleep apnea)    per pt cpap intolerant  . Right shoulder pain   . Seasonal allergic rhinitis   . Vitamin D deficiency     Past Surgical History:  Procedure Laterality Date  . CARDIAC CATHETERIZATION  05-31-2016   @[facility]    normal coronary arteries,  normal EDP  . FINGER FRACTURE SURGERY  1985   pinning right little finger  . HEMORRHOID SURGERY    . KNEE ARTHROSCOPY    . REPLACEMENT UNICONDYLAR JOINT KNEE Bilateral right 2016 approx;  left 10-24-2010  dr Charlann Boxerolin @[facility]   . ROTATOR CUFF REPAIR  x4  left;  x3  right    History reviewed. No pertinent family history. Social History:  reports that he has never smoked. He has never used smokeless tobacco. He reports current alcohol use. He reports that he does not use drugs.  Allergies: No Known Allergies  Medications Prior to Admission  Medication Sig Dispense Refill  . ALPRAZolam (XANAX) 0.5 MG tablet Take 0.5 mg by mouth at bedtime as needed for anxiety.    . Ascorbic Acid (VITA-C PO) Take by mouth as needed.    . ergocalciferol (VITAMIN D2) 1.25 MG (50000 UT) capsule Take 50,000 Units by mouth once a week.    Marland Kitchen. ibuprofen (ADVIL) 200 MG tablet Take 800 mg by mouth every 6 (six) hours as needed.    . Methylcobalamin (B12-ACTIVE PO) Take by mouth as needed.     . montelukast (SINGULAIR) 10 MG tablet Take 10 mg by mouth at bedtime.     Marland Kitchen. omeprazole (PRILOSEC) 20 MG capsule Take 20 mg by mouth as needed.    . Testosterone Cypionate 200 MG/ML SOLN Inject 0.5 mLs as directed once a week.    . traZODone (DESYREL) 100 MG tablet Take 100 mg by mouth at bedtime.    . Albuterol Sulfate, sensor, (PROAIR DIGIHALER) 108 (90 Base) MCG/ACT AEPB Inhale into the lungs as needed. ProAir     . budesonide-formoterol (SYMBICORT) 160-4.5 MCG/ACT inhaler Inhale 2 puffs into the lungs as needed.    . fluticasone furoate-vilanterol (BREO ELLIPTA) 200-25 MCG/INH AEPB Inhale 1 puff into the lungs daily as needed.       No results found for this or any previous visit (from the past 48 hour(s)). No results found.  Review of Systems  Constitutional: Negative.   All other systems reviewed and are negative.   Blood pressure 118/72, pulse 76, temperature 97.8 F (36.6 C), temperature source Oral, resp. rate 12, height 5\' 10"  (1.778 m), weight 86.8 kg, SpO2 96 %. Physical Exam  Constitutional: He is oriented to person, place, and time. He appears well-developed and well-nourished.  HENT:  Head: Normocephalic and atraumatic.  Eyes: Pupils are equal, round, and reactive to light. Conjunctivae and EOM are normal.  Neck: Normal range of motion. Neck supple.  Cardiovascular: Normal rate, regular rhythm and normal heart sounds.  Respiratory: Breath sounds normal.  GI: Soft. Bowel sounds are normal.  Musculoskeletal:     Right shoulder: He exhibits decreased range of motion, tenderness, bony tenderness, pain and decreased strength.  Neurological: He is alert and oriented to person, place, and time. He has normal reflexes.  Skin: Skin is warm.  Psychiatric: He has a normal mood and affect. His behavior is normal. Judgment and thought content normal.     Assessment/Plan 63 year old male with recurrent right shoulder rotator cuff tear symptomatic despite conservative  treatment.  Treatment options were discussed in office he opted for surgical intervention.  Plan will be right shoulder arthroscopic evaluation with revision rotator cuff repair and debridement intra-articular as necessary he understands risk and benefits wishes to proceed. Cynda Familia, MD 04/22/2019, 7:44 AM

## 2019-04-22 NOTE — Op Note (Signed)
[id number]

## 2019-04-22 NOTE — Anesthesia Procedure Notes (Signed)
Procedure Name: Intubation Date/Time: 04/22/2019 8:00 AM Performed by: Bufford Spikes, CRNA Pre-anesthesia Checklist: Patient identified, Emergency Drugs available, Suction available and Patient being monitored Patient Re-evaluated:Patient Re-evaluated prior to induction Oxygen Delivery Method: Circle system utilized Preoxygenation: Pre-oxygenation with 100% oxygen Induction Type: IV induction Ventilation: Mask ventilation without difficulty Laryngoscope Size: Miller and 2 Grade View: Grade II Tube type: Oral Tube size: 7.0 mm Number of attempts: 1 Airway Equipment and Method: Stylet and Oral airway Placement Confirmation: ETT inserted through vocal cords under direct vision,  positive ETCO2 and breath sounds checked- equal and bilateral Secured at: 22 cm Tube secured with: Tape Dental Injury: Teeth and Oropharynx as per pre-operative assessment

## 2019-04-22 NOTE — Progress Notes (Signed)
Assisted Dr. Hodierne with right, ultrasound guided, interscalene  block. Side rails up, monitors on throughout procedure. See vital signs in flow sheet. Tolerated Procedure well. 

## 2019-04-22 NOTE — Op Note (Signed)
NAME: Eric Travis, Doyal G. MEDICAL RECORD [id number] ACCOUNT [id number] DATE OF BIRTH:January 01, 1956 FACILITY: [facility] LOCATION: [facility] PHYSICIAN:Bellarae Lizer Jim DesanctisA. Clatie Kessen, MD  OPERATIVE REPORT  DATE OF PROCEDURE:  04/22/2019  PREOPERATIVE DIAGNOSES: 1.  Right shoulder possible recurrent rotator cuff tear. 2.  Possible intraarticular internal derangement.  POSTOPERATIVE DIAGNOSES: 1.  Right shoulder recurrent medium-sized full-thickness rotator cuff tear. 2.  Intraarticular large biceps tendon stump.  PROCEDURE: 1.  Right shoulder arthroscopy with rotator cuff repair. 2.  Debridement of biceps tendon stump. 3.  Arthroscopic subacromial decompression, acromioplasty, bursectomy, CA ligament release.  SURGEON:  Erasmo Leventhalobert Andrew Kiyana Vazguez, MD  ASSISTANT:  Gigi GinSteven Chabon, PA-C  ANESTHESIA:  Interscalene block general.  ESTIMATED BLOOD LOSS:  Minimal.  DRAINS:  None.  COMPLICATIONS:  None.  DISPOSITION:  PACU, stable.  DESCRIPTION OF PROCEDURE:  The patient counseled in the holding area, correct site was identified, marked and signed appropriately.  IV started.  Sedation was given.  Block was administered.  Taken to the operating room.  IV antibiotics were given.  He  was placed in supine position.  General anesthesia.  Gently placed into a left lateral decubitus position, properly padded and a bump.  PAS stocking applied.  DVT prophylaxis.  Right shoulder examined for range of motion, stable.  It was prepped with  DuraPrep and draped into a sterile fashion.  Time-out done confirming the right side.  We utilized the Arthrex sterile shoulder holder at 30 degrees of abduction, 10 degrees forward flexion, and 15 pounds longitudinal traction and made sure they did not  over distract his shoulder, arm.  Posterior portal was created, and a scope was placed in the glenohumeral joint.  Immediately identifiable was intact articular cartilage and labrum, but there was a large biceps tendon stump superiorly  which was  impinging the joint.  He also had a full-thickness medial side of the rotator cuff tear, and the supraspinatus was identified.  There was some rotator cuff tendinopathy.  Lateral portal was established.  Neurovascular structures were protected, and a  shaver was introduced.  The biceps tendon stump was debrided back to healthy tissue and smoothed down nicely.  The debrided rotator cuff back to healthy tissue.  A subacromial-subdeltoid bursectomy was performed.  The cautery was utilized to take down  the soft tissue from the greater tuberosity and a bougie was utilized to increase the ____ with a light tuberoplasty.  Cautery was utilized to take down the CA ligament in the periosteum.   A bur was then placed posteriorly.  Utilizing the cutting-block  technique, an anterior, inferior, lateral acromioplasty was performed converting to a flat acromial morphology.  AC joint was asymptomatic and distal clavicle was left alone.  Following this, a small puncture was made superiorly.  An Arthrex PEEK anchor  was placed in the greater tuberosity.  Four limbs of suture were placed through the rotator cuff, all FiberTapes.  The arm was then abducted ____ a SwiveLock for suture bridge double-row technique.  At this point in time, an excellent repair of the  rotator cuff down to bleeding bone.  There were no other abnormalities noted.  Endoscope clip was removed.  Portals were closed with #1 suture.  Sterile dressing applied.  Shoulder placed in a shoulder abduction sling, turned supine, awakened and taken  from the operating room to PACU in stable condition.  He will be stabilized in the PACU and discharged home.  Follow up in the office in 2 weeks.  Helped with patient positioning, prepping, draping, technical,  and surgical assistance throughout the entire case, wound closure, application of dressing and sling, Mr.  Narda Amber, PA-C's assistance was needed.  LN/NUANCE  D:04/22/2019 T:04/22/2019  JOB:[id number]

## 2019-04-22 NOTE — Discharge Instructions (Signed)
°Post Anesthesia Home Care Instructions ° °Activity: °Get plenty of rest for the remainder of the day. A responsible individual must stay with you for 24 hours following the procedure.  °For the next 24 hours, DO NOT: °-Drive a car °-Operate machinery °-Drink alcoholic beverages °-Take any medication unless instructed by your physician °-Make any legal decisions or sign important papers. ° °Meals: °Start with liquid foods such as gelatin or soup. Progress to regular foods as tolerated. Avoid greasy, spicy, heavy foods. If nausea and/or vomiting occur, drink only clear liquids until the nausea and/or vomiting subsides. Call your physician if vomiting continues. ° °Special Instructions/Symptoms: °Your throat may feel dry or sore from the anesthesia or the breathing tube placed in your throat during surgery. If this causes discomfort, gargle with warm salt water. The discomfort should disappear within 24 hours. ° °If you had a scopolamine patch placed behind your ear for the management of post- operative nausea and/or vomiting: ° °1. The medication in the patch is effective for 72 hours, after which it should be removed.  Wrap patch in a tissue and discard in the trash. Wash hands thoroughly with soap and water. °2. You may remove the patch earlier than 72 hours if you experience unpleasant side effects which may include dry mouth, dizziness or visual disturbances. °3. Avoid touching the patch. Wash your hands with soap and water after contact with the patch. °   ° ° °Information for Discharge Teaching: °EXPAREL (bupivacaine liposome injectable suspension)  ° °Your surgeon or anesthesiologist gave you EXPAREL(bupivacaine) to help control your pain after surgery.  °· EXPAREL is a local anesthetic that provides pain relief by numbing the tissue around the surgical site. °· EXPAREL is designed to release pain medication over time and can control pain for up to 72 hours. °· Depending on how you respond to EXPAREL, you may  require less pain medication during your recovery. ° °Possible side effects: °· Temporary loss of sensation or ability to move in the area where bupivacaine was injected. °· Nausea, vomiting, constipation °· Rarely, numbness and tingling in your mouth or lips, lightheadedness, or anxiety may occur. °· Call your doctor right away if you think you may be experiencing any of these sensations, or if you have other questions regarding possible side effects. ° °Follow all other discharge instructions given to you by your surgeon or nurse. Eat a healthy diet and drink plenty of water or other fluids. ° °If you return to the hospital for any reason within 96 hours following the administration of EXPAREL, it is important for health care providers to know that you have received this anesthetic. A teal colored band has been placed on your arm with the date, time and amount of EXPAREL you have received in order to alert and inform your health care providers. Please leave this armband in place for the full 96 hours following administration, and then you may remove the band. ° ° °Regional Anesthesia Blocks ° °1. Numbness or the inability to move the "blocked" extremity may last from 3-48 hours after placement. The length of time depends on the medication injected and your individual response to the medication. If the numbness is not going away after 48 hours, call your surgeon. ° °2. The extremity that is blocked will need to be protected until the numbness is gone and the  Strength has returned. Because you cannot feel it, you will need to take extra care to avoid injury. Because it may be weak, you   may have difficulty moving it or using it. You may not know what position it is in without looking at it while the block is in effect. ° °3. For blocks in the legs and feet, returning to weight bearing and walking needs to be done carefully. You will need to wait until the numbness is entirely gone and the strength has returned. You  should be able to move your leg and foot normally before you try and bear weight or walk. You will need someone to be with you when you first try to ensure you do not fall and possibly risk injury. ° °4. Bruising and tenderness at the needle site are common side effects and will resolve in a few days. ° °5. Persistent numbness or new problems with movement should be communicated to the surgeon or the [facility] ([phone])/ [facility] ([phone]). °

## 2019-04-22 NOTE — Anesthesia Preprocedure Evaluation (Signed)
Anesthesia Evaluation  Patient identified by MRN, date of birth, ID band Patient awake    Reviewed: Allergy & Precautions, H&P , NPO status , Patient's Chart, lab work & pertinent test results  Airway Mallampati: II   Neck ROM: full    Dental   Pulmonary asthma , sleep apnea ,    breath sounds clear to auscultation       Cardiovascular negative cardio ROS   Rhythm:regular Rate:Normal     Neuro/Psych PSYCHIATRIC DISORDERS Anxiety Depression    GI/Hepatic GERD  ,  Endo/Other    Renal/GU      Musculoskeletal   Abdominal   Peds  Hematology   Anesthesia Other Findings   Reproductive/Obstetrics                             Anesthesia Physical Anesthesia Plan  ASA: II  Anesthesia Plan: General   Post-op Pain Management:  Regional for Post-op pain   Induction: Intravenous  PONV Risk Score and Plan: 2 and Ondansetron, Dexamethasone, Midazolam and Treatment may vary due to age or medical condition  Airway Management Planned: Oral ETT  Additional Equipment:   Intra-op Plan:   Post-operative Plan: Extubation in OR  Informed Consent: I have reviewed the patients History and Physical, chart, labs and discussed the procedure including the risks, benefits and alternatives for the proposed anesthesia with the patient or authorized representative who has indicated his/her understanding and acceptance.       Plan Discussed with: CRNA, Anesthesiologist and Surgeon  Anesthesia Plan Comments:         Anesthesia Quick Evaluation

## 2019-04-23 ENCOUNTER — Encounter (HOSPITAL_BASED_OUTPATIENT_CLINIC_OR_DEPARTMENT_OTHER): Payer: Self-pay | Admitting: Specialist

## 2019-04-23 NOTE — Anesthesia Postprocedure Evaluation (Signed)
Anesthesia Post Note  Patient: Eric Travis  Procedure(s) Performed: SHOULDER ARTHROSCOPY WITH ROTATOR CUFF REPAIR AND ACROMIOPLASTY (Right Shoulder)     Patient location during evaluation: PACU Anesthesia Type: General Level of consciousness: awake and alert Pain management: pain level controlled Vital Signs Assessment: post-procedure vital signs reviewed and stable Respiratory status: spontaneous breathing, nonlabored ventilation, respiratory function stable and patient connected to nasal cannula oxygen Cardiovascular status: blood pressure returned to baseline and stable Postop Assessment: no apparent nausea or vomiting Anesthetic complications: no    Last Vitals:  Vitals:   04/22/19 1015 04/22/19 1115  BP: 133/71 129/76  Pulse: 88 95  Resp: 15 16  Temp:  36.6 C  SpO2: 96% 95%    Last Pain:  Vitals:   04/23/19 1233  TempSrc:   PainSc: [place]

## 2019-11-17 DIAGNOSIS — S46219A Strain of muscle, fascia and tendon of other parts of biceps, unspecified arm, initial encounter: Secondary | ICD-10-CM | POA: Insufficient documentation

## 2019-11-17 DIAGNOSIS — M75101 Unspecified rotator cuff tear or rupture of right shoulder, not specified as traumatic: Secondary | ICD-10-CM | POA: Insufficient documentation

## 2019-11-24 DIAGNOSIS — Z4789 Encounter for other orthopedic aftercare: Secondary | ICD-10-CM | POA: Insufficient documentation

## 2020-02-11 DIAGNOSIS — M12812 Other specific arthropathies, not elsewhere classified, left shoulder: Secondary | ICD-10-CM | POA: Insufficient documentation

## 2020-04-13 IMAGING — MR MR SHOULDER*R* W/CM
6 series · 40 of 40 positions shown · IV contrast (agent unspecified)
Comparison: Limited arthrogram images from [DATE] [DATE]

CLINICAL DATA: Right shoulder pain weakness for 3 years. Multiple
prior surgeries.

EXAM:
MR ARTHROGRAM OF THE right SHOULDER
TECHNIQUE: Multiplanar, multisequence MR imaging of the right shoulder was
performed following the administration of intra-articular contrast.
CONTRAST:  See Injection Documentation.

[Series 3: T1 fat-sat · axial · 4.0mm · 0.27mm/px · z∈[-59,+44]mm · 8 of 22 slices shown (1 of 4)]
[im 1/22]
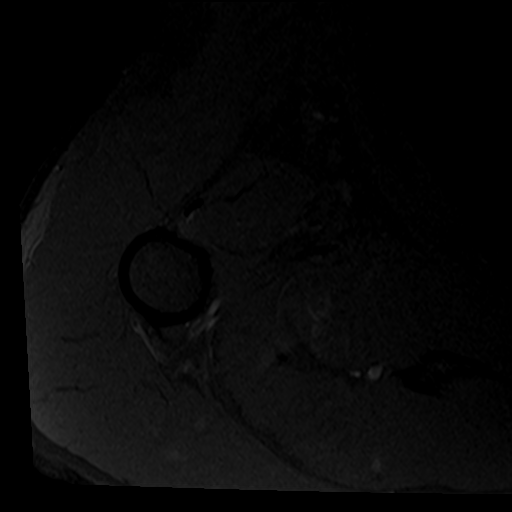
[im 4/22]
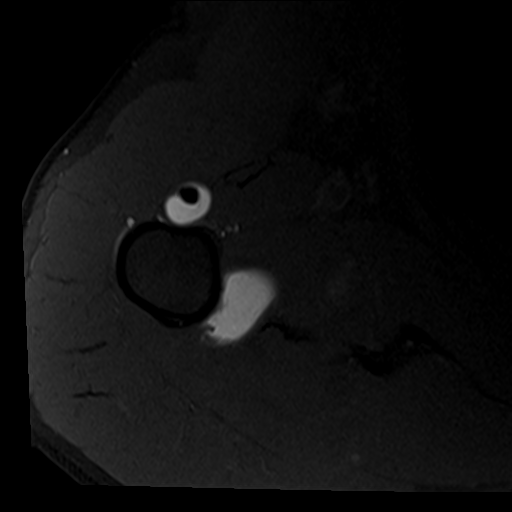
[im 7/22]
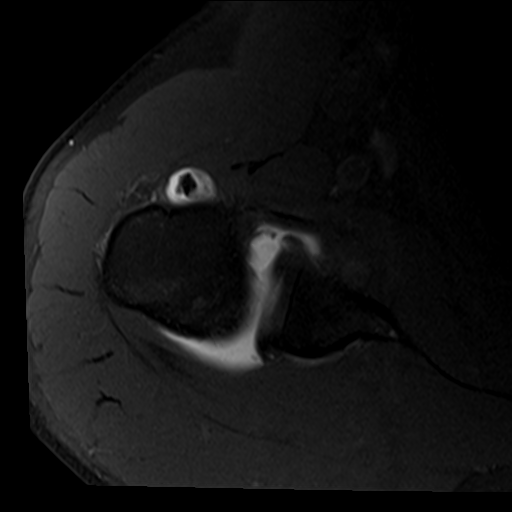
[im 10/22]
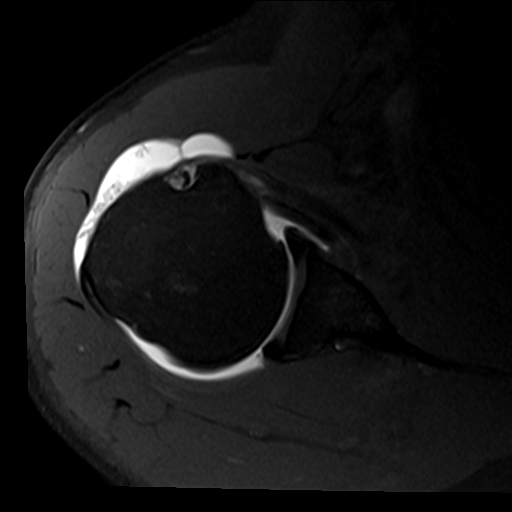
[im 13/22]
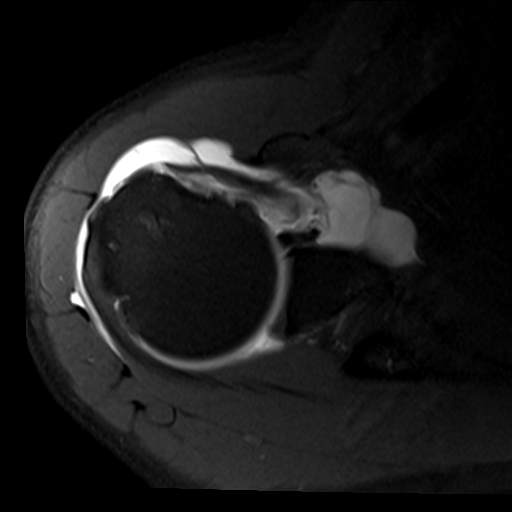
[im 16/22]
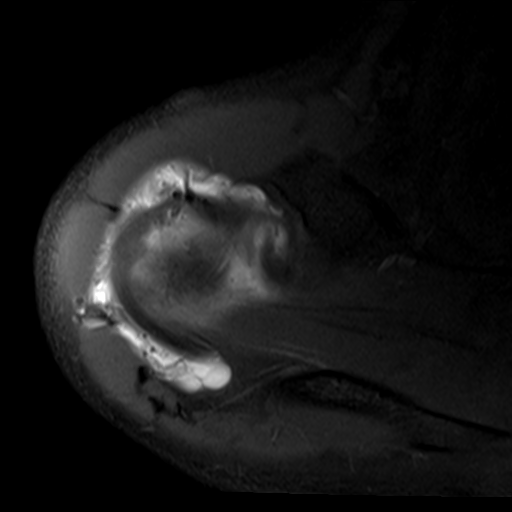
[im 19/22]
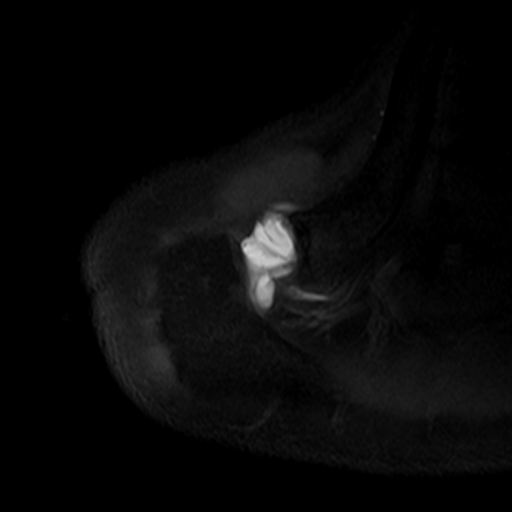
[im 22/22]
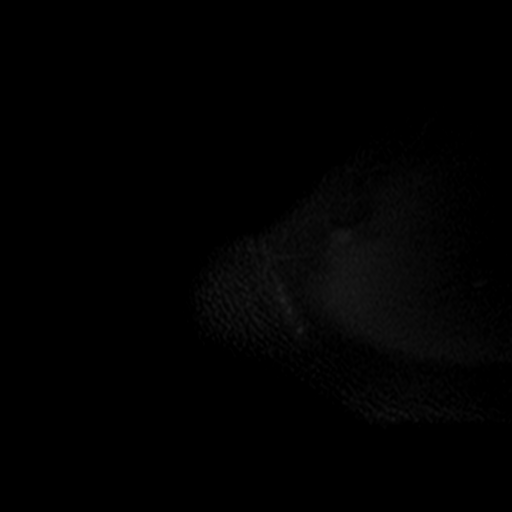

[Series 4: T2 fat-sat · coronal · 4.0mm · 0.55mm/px · 8 of 21 slices shown (1 of 2)]
[im 1/21]
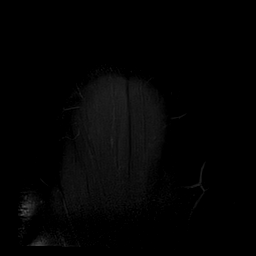
[im 3/21]
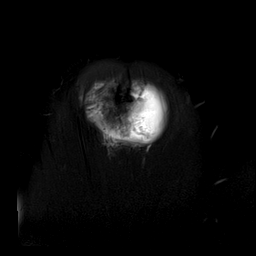
[im 6/21]
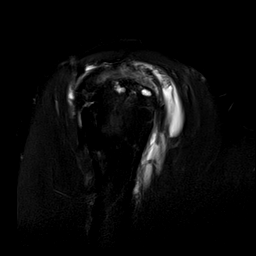
[im 9/21]
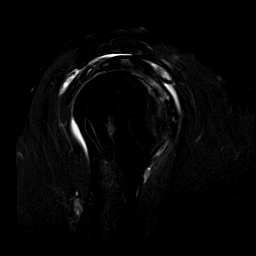
[im 12/21]
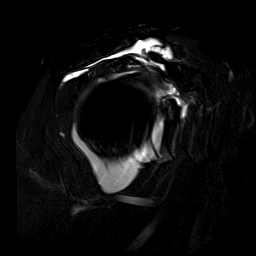
[im 15/21]
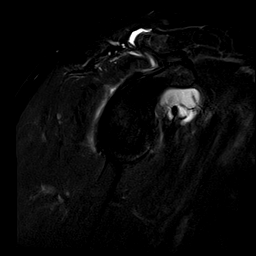
[im 18/21]
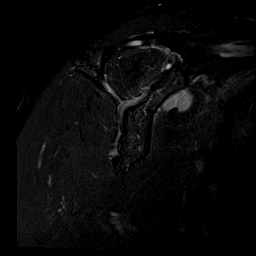
[im 21/21]
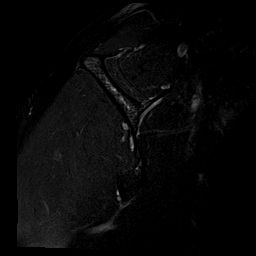

[Series 5: T1 fat-sat · oblique · 4.0mm · 0.55mm/px · 6 of 19 slices shown (2 of 4)]
[im 1/19]
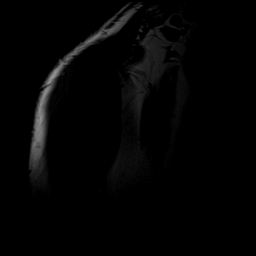
[im 4/19]
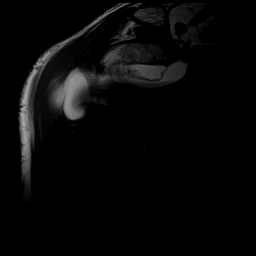
[im 8/19]
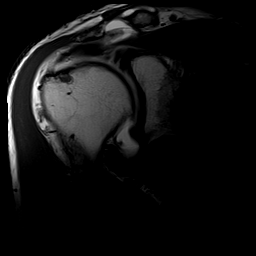
[im 11/19]
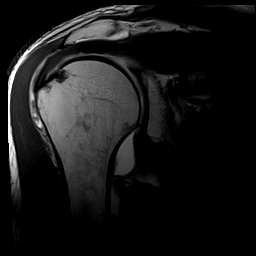
[im 15/19]
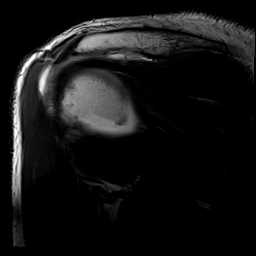
[im 19/19]
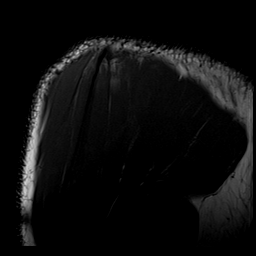

[Series 6: T1 fat-sat · oblique · 4.0mm · 0.55mm/px · 6 of 19 slices shown (3 of 4)]
[im 1/19]
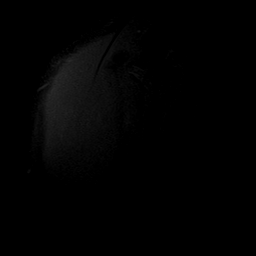
[im 4/19]
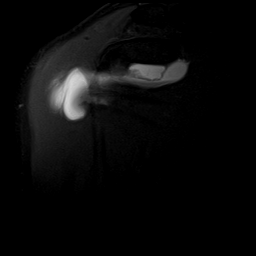
[im 8/19]
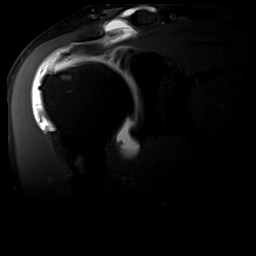
[im 11/19]
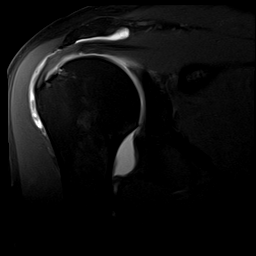
[im 15/19]
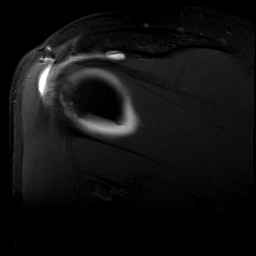
[im 19/19]
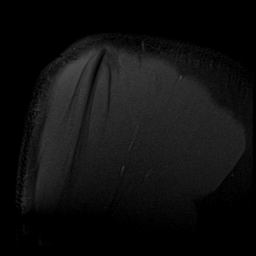

[Series 7: T2 fat-sat · oblique · 4.0mm · 0.55mm/px · 6 of 19 slices shown (2 of 2)]
[im 1/19]
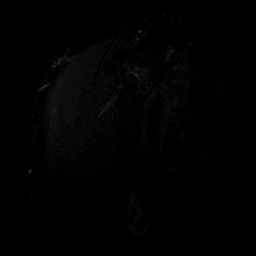
[im 4/19]
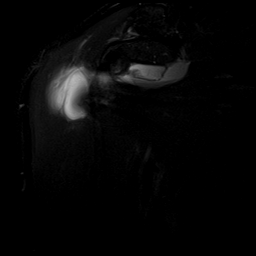
[im 8/19]
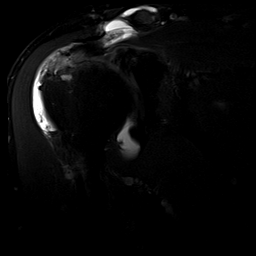
[im 11/19]
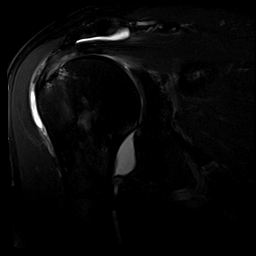
[im 15/19]
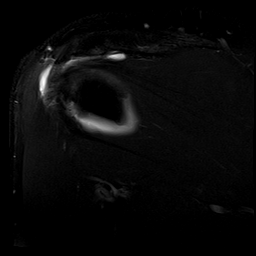
[im 19/19]
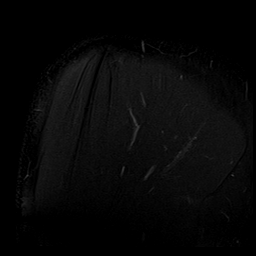

[Series 10: T1 fat-sat · sagittal · 4.0mm · 0.59mm/px · 6 of 18 slices shown (4 of 4)]
[im 1/18]
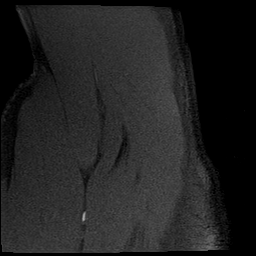
[im 4/18]
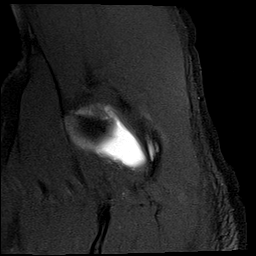
[im 7/18]
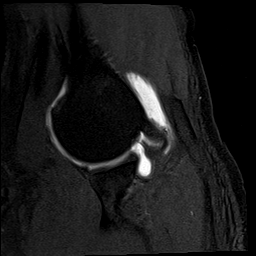
[im 11/18]
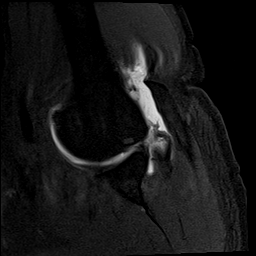
[im 14/18]
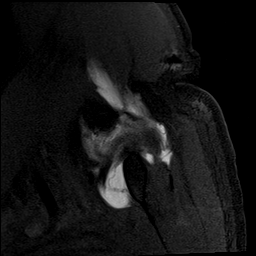
[im 18/18]
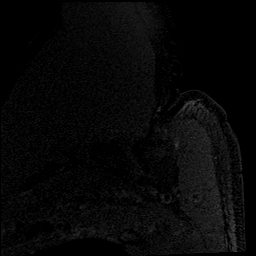

[40 of 40 positions shown; findings below may reference images not displayed]

FINDINGS: Rotator cuff: Full-thickness partial width tear of the distal
anterior supraspinatus tendon with severe supraspinatus and moderate
infraspinatus and subscapularis tendinopathy.

Muscles: Unremarkable

Biceps long head: Poor definition of the intra-articular portion of
the long head, likely from tearing, correlate with operative history
to exclude biceps tenodesis or tenotomy.

Acromioclavicular Joint: Postoperative findings with about 1.5 cm
separation between the distal clavicle and the acromion and prior
acromioplasty. Contrast medium from the joint extends into the
subacromial subdeltoid bursa and into the space between the acromion
and clavicle, and cephalad to the distal clavicle. Subacromial
morphology is type 2 (curved). There is some evidence of debris or
synovitis in the subacromial subdeltoid bursa which is distended
with contrast as shown on image [DATE].

Glenohumeral Joint: Moderate degenerative chondral thinning. Mild
spurring of the humeral head. Small degenerative subcortical cysts
present along the lateral anatomic neck. Well distended axillary
pouch. There is a thickened bandlike structure along the anterior
capsular margin on images 11-13 of series 3, I am uncertain whether
this is a thickened AWOK or a torn portion of the biceps tendon

Labrum: Mild fraying of the inferior labrum on image [DATE] without a
well-defined tear.

Bones: No additional significant bony findings.
IMPRESSION: 1. Full-thickness partial width tear of the distal anterior
supraspinatus tendon with intra-articular contrast medium continuous
with the subacromial subdeltoid bursa, and also extending up between
the acromion and the partially resected distal clavicle.
2. Prominent supraspinatus and moderate infraspinatus and
subscapularis tendinopathy.
3. Poor definition of the intra-articular portion of the long head
of the biceps, likely from tearing, correlate with operative history
to exclude biceps tenodesis or tenotomy as cause.
4. Small amount of debris or synovitis in the subacromial subdeltoid
bursa.
5. Moderate degenerative chondral thinning in the glenohumeral
joint.
6. Somewhat thickened/bandlike structure along the anterior capsular
margin of the joint is probably from a thickened AWOK.
7. Mild fraying of the inferior labrum without a well-defined labral
tear.

## 2020-08-12 DIAGNOSIS — M19012 Primary osteoarthritis, left shoulder: Secondary | ICD-10-CM | POA: Insufficient documentation

## 2020-08-13 ENCOUNTER — Other Ambulatory Visit: Payer: Self-pay | Admitting: Orthopedic Surgery

## 2020-08-13 DIAGNOSIS — M19012 Primary osteoarthritis, left shoulder: Secondary | ICD-10-CM

## 2020-09-01 DIAGNOSIS — G4733 Obstructive sleep apnea (adult) (pediatric): Secondary | ICD-10-CM | POA: Insufficient documentation

## 2020-10-08 NOTE — Pre-Procedure Instructions (Addendum)
[id number] Vilinda Boehringer,  - [address] AT [facility] [address] [place] [address] Phone: [phone] Fax: [phone]     Your procedure is scheduled on Tuesday, 10/12/20, from 12:30 PM- 2:52 PM.  Report to [facility] Main Entrance "A" at 10:30 A.M., and check in at the Admitting office.  Call this number if you have problems the morning of surgery:  [phone]  Call [phone] if you have any questions prior to your surgery date Monday-Friday 8am-4pm.    Remember:  Do not eat after midnight the night before your surgery.  You may drink clear liquids until 09:30 AM the morning of your surgery.   Clear liquids allowed are: Water, Non-Citrus Juices (without pulp), Carbonated Beverages, Clear Tea, Black Coffee Only, and Gatorade.   Enhanced Recovery after Surgery for Orthopedics Enhanced Recovery after Surgery is a protocol used to improve the stress on your body and your recovery after surgery.  Patient Instructions  . The night before surgery:  o No food after midnight. ONLY clear liquids after midnight  .  Marland Kitchen The day of surgery (if you do NOT have diabetes):  o Drink ONE (1) Pre-Surgery Clear Ensure by 09:30 AM the morning of surgery.   o This drink was given to you during your hospital pre-op appointment visit. o Nothing else to drink after completing the Pre-Surgery Clear Ensure.         If you have questions, please contact your surgeon's office.     Take these medicines the morning of surgery with A SIP OF WATER: ezetimibe (ZETIA) omeprazole (PRILOSEC)   As of today, STOP taking any Aspirin (unless otherwise instructed by your surgeon) Aleve, Naproxen, Ibuprofen, Motrin, Advil, Goody's, BC's, all herbal medications, fish oil, and all vitamins.                      Do not wear jewelry.            Do not wear lotions, powders, colognes, or deodorant.            Men may shave face and neck.            Do not bring  valuables to the hospital.            [facility] is not responsible for any belongings or valuables.  Do NOT Smoke (Tobacco/Vaping) or drink Alcohol 24 hours prior to your procedure. If you use a CPAP at night, you may bring all equipment for your overnight stay.   Contacts, glasses, dentures or bridgework may not be worn into surgery.      For patients admitted to the hospital, discharge time will be determined by your treatment team.   Patients discharged the day of surgery will not be allowed to drive home, and someone needs to stay with them for 24 hours.                                    [place]- Preparing for Total Shoulder Arthroplasty   Before surgery, you can play an important role. Because skin is not sterile, your skin needs to be as free of germs as possible. You can reduce the number of germs on your skin by using the following products. . Benzoyl Peroxide Gel o Reduces the number of germs present on the skin o Applied twice a day to shoulder  area starting two days before surgery   . Chlorhexidine Gluconate (CHG) Soap o An antiseptic cleaner that kills germs and bonds with the skin to continue killing germs even after washing o Used for showering the night before surgery and morning of surgery   Oral Hygiene is also important to reduce your risk of infection.                                    Remember - BRUSH YOUR TEETH THE MORNING OF SURGERY WITH YOUR REGULAR TOOTHPASTE  ==================================================================  Please follow these instructions carefully:  BENZOYL PEROXIDE 5% GEL  Please do not use if you have an allergy to benzoyl peroxide.   If your skin becomes reddened/irritated stop using the benzoyl peroxide.  Starting two days before surgery, apply as follows: 1. Apply benzoyl peroxide in the morning and at night. Apply after taking a shower. If you are not taking a shower clean entire shoulder front, back, and side along with  the armpit with a clean wet washcloth.  2. Place a quarter-sized dollop on your shoulder and rub in thoroughly, making sure to cover the front, back, and side of your shoulder, along with the armpit.   2 days before ____ AM   ____ PM              1 day before ____ AM   ____ PM                            3.  Do this twice a day for two days.  (Last application is the night before surgery, AFTER using the CHG soap as described below).  4. Do NOT apply benzoyl peroxide gel on the day of surgery.  CHLORHEXIDINE GLUCONATE (CHG) SOAP  Please do not use if you have an allergy to CHG or antibacterial soaps. If your skin becomes reddened/irritated stop using the CHG.   Do not shave (including legs and underarms) for at least 48 hours prior to first CHG shower. It is OK to shave your face.  Starting the night before surgery, use CHG soap as follows:  1. Shower the NIGHT BEFORE SURGERY and MORNING OF SURGERY with CHG.  2. If you choose to wash your hair, wash your hair first as usual with your normal shampoo.  3. After shampooing, rinse your hair and body thoroughly to remove the shampoo.  4. Use CHG as you would any other liquid soap.  You can apply CHG directly to the skin and wash gently with a scrungie or a clean washcloth.  5. Apply the CHG soap to your body ONLY FROM THE NECK DOWN.  Do not use on open wounds or open sores.  Avoid contact with your eyes, ears, mouth, and genitals (private parts).  Wash face and genitals (private parts) with your normal soap.  6. Wash thoroughly, paying special attention to the area where your surgery will be performed.  7. Thoroughly rinse your body with warm water from the neck down.  8. DO NOT shower/wash with your normal soap after using and rinsing off the CHG soap.   9. Pat yourself dry with a CLEAN TOWEL.   10.  Apply benzoyl peroxide.   11. Wear CLEAN PAJAMAS to bed the night before surgery; wear comfortable clothes the morning of  surgery.  12. Place CLEAN SHEETS on your bed the  night of your first shower and DO NOT SLEEP WITH PETS.  Day of Surgery: Shower as above Do not apply any deodorants/lotions.  Please wear clean clothes to the hospital/surgery center.   Remember to brush your teeth WITH YOUR REGULAR TOOTHPASTE.      Please read over the following fact sheets that you were given.

## 2020-10-11 ENCOUNTER — Encounter (HOSPITAL_COMMUNITY)
Admission: RE | Admit: 2020-10-11 | Discharge: 2020-10-11 | Disposition: A | Discharge status: Home / Self Care | Payer: BC Managed Care – PPO | Source: Ambulatory Visit | Attending: Orthopedic Surgery | Admitting: Orthopedic Surgery

## 2020-10-11 ENCOUNTER — Encounter (HOSPITAL_COMMUNITY): Payer: Self-pay

## 2020-10-11 ENCOUNTER — Other Ambulatory Visit (HOSPITAL_COMMUNITY)
Admission: RE | Admit: 2020-10-11 | Discharge: 2020-10-11 | Disposition: A | Discharge status: Home / Self Care | Payer: BC Managed Care – PPO | Source: Ambulatory Visit | Attending: Orthopedic Surgery | Admitting: Orthopedic Surgery

## 2020-10-11 ENCOUNTER — Other Ambulatory Visit: Payer: Self-pay

## 2020-10-11 DIAGNOSIS — A4901 Methicillin susceptible Staphylococcus aureus infection, unspecified site: Secondary | ICD-10-CM | POA: Insufficient documentation

## 2020-10-11 DIAGNOSIS — M75112 Incomplete rotator cuff tear or rupture of left shoulder, not specified as traumatic: Secondary | ICD-10-CM | POA: Diagnosis not present

## 2020-10-11 DIAGNOSIS — Z79899 Other long term (current) drug therapy: Secondary | ICD-10-CM | POA: Diagnosis not present

## 2020-10-11 DIAGNOSIS — M19012 Primary osteoarthritis, left shoulder: Secondary | ICD-10-CM | POA: Diagnosis present

## 2020-10-11 DIAGNOSIS — Z20822 Contact with and (suspected) exposure to covid-19: Secondary | ICD-10-CM | POA: Insufficient documentation

## 2020-10-11 DIAGNOSIS — Z01812 Encounter for preprocedural laboratory examination: Secondary | ICD-10-CM | POA: Insufficient documentation

## 2020-10-11 HISTORY — DX: Unspecified osteoarthritis, unspecified site: M19.90

## 2020-10-11 LAB — CBC
HCT: 48.1 % (ref 39.0–52.0)
Hemoglobin: 16.1 g/dL (ref 13.0–17.0)
MCH: 30.8 pg (ref 26.0–34.0)
MCHC: 33.5 g/dL (ref 30.0–36.0)
MCV: 92.1 fL (ref 80.0–100.0)
Platelets: 240 10*3/uL (ref 150–400)
RBC: 5.22 MIL/uL (ref 4.22–5.81)
RDW: 12.5 % (ref 11.5–15.5)
WBC: 5 10*3/uL (ref 4.0–10.5)
nRBC: 0 % (ref 0.0–0.2)

## 2020-10-11 LAB — SARS CORONAVIRUS 2 (TAT 6-24 HRS): SARS Coronavirus 2: NEGATIVE

## 2020-10-11 LAB — SURGICAL PCR SCREEN
MRSA, PCR: NEGATIVE
Staphylococcus aureus: POSITIVE — AB

## 2020-10-11 NOTE — Progress Notes (Signed)
PCP - Eric Travis Cardiologist - John Powers  ([facility]) in 2017    Chest x-ray - na EKG -  na Stress Test -  na ECHO - 9/17 Cardiac Cath -  9/17  Sleep Study -  Dr. Antonietta Breach in [place] -request study CPAP - no    Blood Thinner Instructions:  na Aspirin Instructions:   na  ERAS Protcol - yes PRE-SURGERY Ensure  given  COVID TEST-  10/11/20   Anesthesia review:   Patient denies shortness of breath, fever, cough and chest pain at PAT appointment   All instructions explained to the patient, with a verbal understanding of the material. Patient agrees to go over the instructions while at home for a better understanding. Patient also instructed to self quarantine after being tested for COVID-19. The opportunity to ask questions was provided.

## 2020-10-11 NOTE — Anesthesia Preprocedure Evaluation (Addendum)
Anesthesia Evaluation  Patient identified by MRN, date of birth, ID band Patient awake    Reviewed: Allergy & Precautions, NPO status , Patient's Chart, lab work & pertinent test results  History of Anesthesia Complications Negative for: history of anesthetic complications  Airway Mallampati: II  TM Distance: >3 FB Neck ROM: Full    Dental no notable dental hx.    Pulmonary asthma , sleep apnea ,    Pulmonary exam normal        Cardiovascular negative cardio ROS Normal cardiovascular exam     Neuro/Psych negative neurological ROS  negative psych ROS   GI/Hepatic Neg liver ROS, GERD  Controlled and Medicated,  Endo/Other  negative endocrine ROS  Renal/GU negative Renal ROS  negative genitourinary   Musculoskeletal Left shoulder rotator cuff arthropathy   Abdominal   Peds  Hematology negative hematology ROS (+)   Anesthesia Other Findings Day of surgery medications reviewed with patient.  Reproductive/Obstetrics negative OB ROS                            Anesthesia Physical Anesthesia Plan  ASA: II  Anesthesia Plan: General   Post-op Pain Management: GA combined w/ Regional for post-op pain   Induction: Intravenous  PONV Risk Score and Plan: 2 and Treatment may vary due to age or medical condition, Ondansetron, Dexamethasone and Midazolam  Airway Management Planned: Oral ETT  Additional Equipment: None  Intra-op Plan:   Post-operative Plan: Extubation in OR  Informed Consent: I have reviewed the patients History and Physical, chart, labs and discussed the procedure including the risks, benefits and alternatives for the proposed anesthesia with the patient or authorized representative who has indicated his/her understanding and acceptance.     Dental advisory given  Plan Discussed with: CRNA  Anesthesia Plan Comments: (PAT note written 10/11/2020 by Shonna Chock,  PA-C. )      Anesthesia Quick Evaluation

## 2020-10-11 NOTE — Pre-Procedure Instructions (Signed)
[id number] Vilinda Boehringer, [place] - [address] AT [facility] [address] Olevia [place] [address] Phone: [phone] Fax: [phone]     Your procedure is scheduled on Tuesday, 10/12/20, from 12:30 PM- 2:52 PM.  Report to [facility] Main Entrance "A" at 10:30 A.M., and check in at the Admitting office.  Call this number if you have problems the morning of surgery:  [phone]  Call [phone] if you have any questions prior to your surgery date Monday-Friday 8am-4pm.    Remember:  Do not eat after midnight the night before your surgery.  You may drink clear liquids until 09:30 AM the morning of your surgery.   Clear liquids allowed are: Water, Non-Citrus Juices (without pulp), Carbonated Beverages, Clear Tea, Black Coffee Only, and Gatorade.   Enhanced Recovery after Surgery for Orthopedics Enhanced Recovery after Surgery is a protocol used to improve the stress on your body and your recovery after surgery.  Patient Instructions  . The night before surgery:  o No food after midnight. ONLY clear liquids after midnight  .  Marland Kitchen The day of surgery (if you do NOT have diabetes):  o Drink ONE (1) Pre-Surgery Clear Ensure by 09:30 AM the morning of surgery.   o This drink was given to you during your hospital pre-op appointment visit. o Nothing else to drink after completing the Pre-Surgery Clear Ensure.         If you have questions, please contact your surgeon's office.     Take these medicines the morning of surgery with A SIP OF WATER: ezetimibe (ZETIA) omeprazole (PRILOSEC)   As of today, STOP taking any Aspirin (unless otherwise instructed by your surgeon) Aleve, Naproxen, Ibuprofen, Motrin, Advil, Goody's, BC's, all herbal medications, fish oil, and all vitamins.                      Do not wear jewelry.            Do not wear lotions, powders, colognes, or deodorant.            Men may shave face and neck.            Do not bring  valuables to the hospital.            [facility] is not responsible for any belongings or valuables.  Do NOT Smoke (Tobacco/Vaping) or drink Alcohol 24 hours prior to your procedure. If you use a CPAP at night, you may bring all equipment for your overnight stay.   Contacts, glasses, dentures or bridgework may not be worn into surgery.      For patients admitted to the hospital, discharge time will be determined by your treatment team.   Patients discharged the day of surgery will not be allowed to drive home, and someone needs to stay with them for 24 hours.  [place]- Preparing for Shoulder Surgery  ?  Before surgery, you can play an important role. Because skin is not sterile, your skin needs to be as free of germs as possible. You can reduce the number of germs on your skin by using the following products.   1). Benzoyl Peroxide Gel: reduces the number of germs present on the skin   *Applied twice a day to shoulder area starting two days before surgery     2). Chlorhexidine Gluconate (CHG) Soap: An antiseptic cleaner that kills germs and bonds with the skin to continue killing germs even after  washing   *Used for showering the night before surgery and morning of surgery   ?    Please follow these instructions carefully:     1). BENZOYL PEROXIDE 5% GEL (Please do not use if you have an allergy to benzoyl peroxide.   If your skin becomes reddened/irritated stop using the benzoyl peroxide)     Starting TWO DAYS BEFORE surgery:    Apply benzoyl peroxide in the morning and at night. Apply after taking a shower. If you are not taking a shower clean entire shoulder front, back, and side along with the armpit with a clean wet washcloth.     Place a quarter-sized amount of gel on your shoulder and rub in thoroughly, making sure to cover the front, back, and side of your shoulder, along with the armpit.                           Do this twice a day for two days.  (Last  application is the night before surgery, AFTER using the CHG soap as described below).   Do NOT apply benzoyl peroxide gel on the day of surgery.   2 days before ____ AM   ____ PM              1 day before ____ AM   ____ PM    2) CHG Soap: Please do not use if you have an allergy to CHG or antibacterial soaps. If your skin becomes reddened/irritated stop using the CHG.  Do not shave (including legs and underarms) for at least 48 hours prior to first CHG shower. It is OK to shave your face.  Please follow these instructions carefully.   1. Shower the NIGHT BEFORE SURGERY (before applying benzoyl peroxide gel) and the MORNING OF SURGERY with CHG Soap.   2. If you chose to wash your hair, wash your hair first as usual with your normal shampoo.  3. After you shampoo, rinse your hair and body thoroughly to remove the shampoo.  4. Use CHG as you would any other liquid soap. You can apply CHG directly to the skin and wash gently with a scrungie or a clean washcloth.   5. Apply the CHG Soap to your body ONLY FROM THE NECK DOWN.  Do not use on open wounds or open sores. Avoid contact with your eyes, ears, mouth and genitals (private parts). Wash Face and genitals (private parts) with your normal soap.   6. Wash thoroughly, paying special attention to the area where your surgery will be performed.  7. Thoroughly rinse your body with warm water from the neck down.  8. DO NOT shower/wash with your normal soap after using and rinsing off the CHG Soap.  9. Pat yourself dry with a CLEAN TOWEL.  10. Wear CLEAN PAJAMAS to bed the night before surgery  11. Place CLEAN SHEETS on your bed the night of your first shower and DO NOT SLEEP WITH PETS.  Oral Hygiene is also important to reduce your risk of infection.  Remember - BRUSH YOUR TEETH THE MORNING OF SURGERY WITH YOUR REGULAR TOOTHPASTE  Day of Surgery: Wear Clean/Comfortable clothing the morning of surgery Do not apply any deodorants/lotions.    Remember to brush your teeth WITH YOUR REGULAR TOOTHPASTE.    Please read over the following fact sheets that you were given.

## 2020-10-11 NOTE — Progress Notes (Signed)
Anesthesia Chart Review:  Case: [id number] Date/Time: 10/12/20 1215   Procedure: REVERSE SHOULDER ARTHROPLASTY (Left Shoulder) - 2.5 hrs   Anesthesia type: Choice   Pre-op diagnosis: Left shoulder rotator cuff arthropathy   [facility] ROOM 09 / [facility] OR   Surgeons: Yolonda Kida, MD      DISCUSSION: Patient is a 65 year old male scheduled for the above procedure.  History includes never smoker, dyslipidemia, GERD, asthma (moderate persistent), OSA (intolerant to CPAP). Normal coronaries in 2017 [facility]). S/p drug induced endoscopy through [facility] 09/15/20 (for evaluation of Inspire since he is intolerant to CPAP). By [facility] records, "AHI 16.8. 101 apnea and hypoapnea. 29 central apnea. 30%. Sleep study in 2017". COVID-19 09/22/19.   10/11/20 presurgical COVID-19 test is in process. Anesthesia team to evaluate on the day of surgery.    VS: BP 132/90   Pulse 91   Temp 36.6 C (Oral)   Resp 18   Ht 5\' 10"  (1.778 m)   Wt 87.7 kg   SpO2 98%   BMI 27.75 kg/m    PROVIDERS: , MD is PCP ([facility] Care Everywhere) - [place], MD is ENT [facility] Everywhere) - Urologist is with [facility] (see Care Everywhere) - He saw cardiologist [facility], MD with [facility] in 2017 for chest pain and had normal coronaries by [facility].  - Last pulmonology visit seen is with [facility], MD with [facility] Everywhere)   LABS: Labs reviewed: Acceptable for surgery. He had an unremarkable afternoon CMET (normal except glucose 114 on 01/29/20). (all labs ordered are listed, but only abnormal results are displayed)  Labs Reviewed  SURGICAL PCR SCREEN  CBC    EKG: N/A   CV: Cardiac cath 05/31/16 ([facility]): CONCLUSIONS: 1. Normal coronaries 2. Normal EDP  Echo 05/31/16 ([facility]): Interpretation Summary A complete two-dimensional transthoracic echocardiogram with color  flow Doppler and spectral Doppler was performed. The left ventricle is normal in size. There is normal left ventricular wall thickness. The left ventricular ejection fraction is normal (55-60%). The left ventricular wall motion is normal. The aortic valve is trileaflet. Grade I mild diastolic dysfunction; abnormal relaxation pattern. Right ventricular systolic pressure is normal.   Past Medical History:  Diagnosis Date  . Arthritis   . Dyslipidemia   . ED (erectile dysfunction)   . GERD (gastroesophageal reflux disease)   . History of acute prostatitis 2011  . Hypogonadism male   . Moderate persistent asthma    pulmonologist-  dr j. 2012 @ [facility] in [place], (seasonial asthma)  . OSA (obstructive sleep apnea)    per pt cpap intolerant  . Right shoulder pain   . Seasonal allergic rhinitis   . Vitamin D deficiency     Past Surgical History:  Procedure Laterality Date  . CARDIAC CATHETERIZATION  05-31-2016   @[facility]    normal coronary arteries,  normal EDP  . FINGER FRACTURE SURGERY  1985   pinning right little finger  . HEMORRHOID SURGERY    . KNEE ARTHROSCOPY    . REPLACEMENT UNICONDYLAR JOINT KNEE Bilateral right 2016 approx;  left 10-24-2010  dr 2017 @[facility]   . ROTATOR CUFF REPAIR  x4  left;  x3  right  . SHOULDER ARTHROSCOPY WITH ROTATOR CUFF REPAIR Right 04/22/2019   Procedure: SHOULDER ARTHROSCOPY WITH ROTATOR CUFF REPAIR AND ACROMIOPLASTY;  Surgeon: Charlann Boxer, MD;  Location: [facility];  Service: Orthopedics;  Laterality: Right;  intersclene block    MEDICATIONS: . ALPRAZolam (XANAX) 0.5 MG tablet  . Ascorbic Acid (VITA-C PO)  . ergocalciferol (VITAMIN D2) 1.25 MG (50000 UT) capsule  . ezetimibe (ZETIA) 10 MG tablet  . ibuprofen (ADVIL) 200 MG tablet  . Methylcobalamin (B12-ACTIVE PO)  . montelukast (SINGULAIR) 10 MG tablet  . omeprazole (PRILOSEC) 20 MG capsule  . pravastatin (PRAVACHOL) 20 MG tablet  . Testosterone Cypionate 200 MG/ML SOLN   . zinc gluconate 50 MG tablet   No current facility-administered medications for this encounter.    Shonna Chock, PA-C Surgical Short Stay/Anesthesiology [facility] Phone [phone] [facility] Phone [phone] 10/11/2020 12:06 PM

## 2020-10-12 ENCOUNTER — Encounter (HOSPITAL_COMMUNITY)
Admission: RE | Disposition: A | Discharge status: Home / Self Care | Payer: Self-pay | Source: Home / Self Care | Attending: Orthopedic Surgery

## 2020-10-12 ENCOUNTER — Ambulatory Visit (HOSPITAL_COMMUNITY): Payer: BC Managed Care – PPO

## 2020-10-12 ENCOUNTER — Ambulatory Visit (HOSPITAL_COMMUNITY)
Admission: RE | Admit: 2020-10-12 | Discharge: 2020-10-12 | Disposition: A | Discharge status: Home / Self Care | Payer: BC Managed Care – PPO | Attending: Orthopedic Surgery | Admitting: Orthopedic Surgery

## 2020-10-12 ENCOUNTER — Encounter (HOSPITAL_COMMUNITY): Payer: Self-pay | Admitting: Orthopedic Surgery

## 2020-10-12 ENCOUNTER — Ambulatory Visit (HOSPITAL_COMMUNITY): Payer: BC Managed Care – PPO | Admitting: Physician Assistant

## 2020-10-12 ENCOUNTER — Ambulatory Visit (HOSPITAL_COMMUNITY): Payer: BC Managed Care – PPO | Admitting: Certified Registered Nurse Anesthetist

## 2020-10-12 ENCOUNTER — Other Ambulatory Visit: Payer: Self-pay

## 2020-10-12 DIAGNOSIS — Z79899 Other long term (current) drug therapy: Secondary | ICD-10-CM | POA: Insufficient documentation

## 2020-10-12 DIAGNOSIS — M19012 Primary osteoarthritis, left shoulder: Secondary | ICD-10-CM | POA: Diagnosis not present

## 2020-10-12 DIAGNOSIS — Z96612 Presence of left artificial shoulder joint: Secondary | ICD-10-CM

## 2020-10-12 DIAGNOSIS — Z20822 Contact with and (suspected) exposure to covid-19: Secondary | ICD-10-CM | POA: Insufficient documentation

## 2020-10-12 DIAGNOSIS — M75112 Incomplete rotator cuff tear or rupture of left shoulder, not specified as traumatic: Secondary | ICD-10-CM | POA: Insufficient documentation

## 2020-10-12 HISTORY — PX: REVERSE SHOULDER ARTHROPLASTY: SHX5054

## 2020-10-12 SURGERY — ARTHROPLASTY, SHOULDER, TOTAL, REVERSE
Anesthesia: General | Site: Shoulder | Laterality: Left

## 2020-10-12 MED ORDER — FENTANYL CITRATE (PF) 250 MCG/5ML IJ SOLN
INTRAMUSCULAR | Status: AC
  Filled 2020-10-12: qty 5

## 2020-10-12 MED ORDER — PHENYLEPHRINE HCL-NACL 10-0.9 MG/250ML-% IV SOLN
INTRAVENOUS | Status: DC | PRN
  Administered 2020-10-12: 25 ug/min via INTRAVENOUS
  Administered 2020-10-12: 40 ug/min via INTRAVENOUS

## 2020-10-12 MED ORDER — TRANEXAMIC ACID-NACL 1000-0.7 MG/100ML-% IV SOLN
INTRAVENOUS | Status: AC
  Filled 2020-10-12: qty 100

## 2020-10-12 MED ORDER — OXYCODONE HCL 5 MG PO TABS: 5.0000 mg | ORAL_TABLET | Freq: Once | ORAL | Status: DC | PRN

## 2020-10-12 MED ORDER — PHENYLEPHRINE 40 MCG/ML (10ML) SYRINGE FOR IV PUSH (FOR BLOOD PRESSURE SUPPORT)
PREFILLED_SYRINGE | INTRAVENOUS | Status: AC
  Filled 2020-10-12: qty 10

## 2020-10-12 MED ORDER — ORAL CARE MOUTH RINSE: 15.0000 mL | Freq: Once | OROMUCOSAL | Status: AC

## 2020-10-12 MED ORDER — PHENYLEPHRINE 40 MCG/ML (10ML) SYRINGE FOR IV PUSH (FOR BLOOD PRESSURE SUPPORT)
PREFILLED_SYRINGE | INTRAVENOUS | Status: DC | PRN
  Administered 2020-10-12: 160 ug via INTRAVENOUS
  Administered 2020-10-12: 120 ug via INTRAVENOUS

## 2020-10-12 MED ORDER — CHLORHEXIDINE GLUCONATE 0.12 % MT SOLN: 15.0000 mL | Freq: Once | OROMUCOSAL | Status: AC

## 2020-10-12 MED ORDER — CHLORHEXIDINE GLUCONATE 0.12 % MT SOLN
OROMUCOSAL | Status: AC
  Administered 2020-10-12: 15 mL via OROMUCOSAL
  Filled 2020-10-12: qty 15

## 2020-10-12 MED ORDER — OXYCODONE HCL 5 MG PO TABS: 5.0000 mg | ORAL_TABLET | ORAL | 0 refills | Status: AC | PRN

## 2020-10-12 MED ORDER — METHOCARBAMOL 500 MG PO TABS: 500.0000 mg | ORAL_TABLET | Freq: Four times a day (QID) | ORAL | 0 refills | Status: AC | PRN

## 2020-10-12 MED ORDER — TRANEXAMIC ACID-NACL 1000-0.7 MG/100ML-% IV SOLN
1000.0000 mg | INTRAVENOUS | Status: AC
  Administered 2020-10-12: 1000 mg via INTRAVENOUS

## 2020-10-12 MED ORDER — ONDANSETRON HCL 4 MG/2ML IJ SOLN
INTRAMUSCULAR | Status: AC
  Filled 2020-10-12: qty 2

## 2020-10-12 MED ORDER — PROPOFOL 10 MG/ML IV BOLUS
INTRAVENOUS | Status: DC | PRN
  Administered 2020-10-12: 180 mg via INTRAVENOUS

## 2020-10-12 MED ORDER — VANCOMYCIN HCL 1000 MG IV SOLR
INTRAVENOUS | Status: AC
  Filled 2020-10-12: qty 1000

## 2020-10-12 MED ORDER — PHENYLEPHRINE HCL (PRESSORS) 10 MG/ML IV SOLN
INTRAVENOUS | Status: AC
  Filled 2020-10-12: qty 1

## 2020-10-12 MED ORDER — LACTATED RINGERS IV SOLN: INTRAVENOUS | Status: DC

## 2020-10-12 MED ORDER — VANCOMYCIN HCL 1 G IV SOLR
INTRAVENOUS | Status: DC | PRN
  Administered 2020-10-12: 1000 mg via TOPICAL

## 2020-10-12 MED ORDER — BUPIVACAINE LIPOSOME 1.3 % IJ SUSP
INTRAMUSCULAR | Status: DC | PRN
  Administered 2020-10-12: 10 mL via PERINEURAL

## 2020-10-12 MED ORDER — FENTANYL CITRATE (PF) 100 MCG/2ML IJ SOLN
50.0000 ug | Freq: Once | INTRAMUSCULAR | Status: AC
  Filled 2020-10-12: qty 1

## 2020-10-12 MED ORDER — OXYCODONE HCL 5 MG/5ML PO SOLN: 5.0000 mg | Freq: Once | ORAL | Status: DC | PRN

## 2020-10-12 MED ORDER — MIDAZOLAM HCL 2 MG/2ML IJ SOLN
INTRAMUSCULAR | Status: AC
  Filled 2020-10-12: qty 2

## 2020-10-12 MED ORDER — FENTANYL CITRATE (PF) 100 MCG/2ML IJ SOLN: 25.0000 ug | INTRAMUSCULAR | Status: DC | PRN

## 2020-10-12 MED ORDER — FENTANYL CITRATE (PF) 100 MCG/2ML IJ SOLN
INTRAMUSCULAR | Status: DC | PRN
  Administered 2020-10-12: 100 ug via INTRAVENOUS

## 2020-10-12 MED ORDER — PROMETHAZINE HCL 25 MG/ML IJ SOLN: 6.2500 mg | INTRAMUSCULAR | Status: DC | PRN

## 2020-10-12 MED ORDER — LIDOCAINE 2% (20 MG/ML) 5 ML SYRINGE
INTRAMUSCULAR | Status: DC | PRN
  Administered 2020-10-12: 100 mg via INTRAVENOUS

## 2020-10-12 MED ORDER — BUPIVACAINE-EPINEPHRINE (PF) 0.5% -1:200000 IJ SOLN
INTRAMUSCULAR | Status: DC | PRN
  Administered 2020-10-12: 15 mL via PERINEURAL

## 2020-10-12 MED ORDER — DEXAMETHASONE SODIUM PHOSPHATE 10 MG/ML IJ SOLN
INTRAMUSCULAR | Status: AC
  Filled 2020-10-12: qty 1

## 2020-10-12 MED ORDER — CEFAZOLIN SODIUM-DEXTROSE 2-4 GM/100ML-% IV SOLN
INTRAVENOUS | Status: AC
  Filled 2020-10-12: qty 100

## 2020-10-12 MED ORDER — EPHEDRINE 5 MG/ML INJ
INTRAVENOUS | Status: AC
  Filled 2020-10-12: qty 10

## 2020-10-12 MED ORDER — BUPIVACAINE-EPINEPHRINE (PF) 0.25% -1:200000 IJ SOLN
INTRAMUSCULAR | Status: AC
  Filled 2020-10-12: qty 20

## 2020-10-12 MED ORDER — ONDANSETRON 4 MG PO TBDP: 4.0000 mg | ORAL_TABLET | Freq: Three times a day (TID) | ORAL | 0 refills | Status: AC | PRN

## 2020-10-12 MED ORDER — PROPOFOL 10 MG/ML IV BOLUS
INTRAVENOUS | Status: AC
  Filled 2020-10-12: qty 20

## 2020-10-12 MED ORDER — ONDANSETRON HCL 4 MG/2ML IJ SOLN
INTRAMUSCULAR | Status: DC | PRN
  Administered 2020-10-12: 4 mg via INTRAVENOUS

## 2020-10-12 MED ORDER — ALBUMIN HUMAN 5 % IV SOLN: INTRAVENOUS | Status: DC | PRN

## 2020-10-12 MED ORDER — CEFAZOLIN SODIUM-DEXTROSE 2-4 GM/100ML-% IV SOLN
2.0000 g | INTRAVENOUS | Status: AC
  Administered 2020-10-12: 2 g via INTRAVENOUS

## 2020-10-12 MED ORDER — LIDOCAINE 2% (20 MG/ML) 5 ML SYRINGE
INTRAMUSCULAR | Status: AC
  Filled 2020-10-12: qty 10

## 2020-10-12 MED ORDER — FENTANYL CITRATE (PF) 100 MCG/2ML IJ SOLN
INTRAMUSCULAR | Status: AC
  Administered 2020-10-12: 50 ug via INTRAVENOUS
  Filled 2020-10-12: qty 2

## 2020-10-12 MED ORDER — ACETAMINOPHEN 500 MG PO TABS
1000.0000 mg | ORAL_TABLET | Freq: Once | ORAL | Status: AC
  Administered 2020-10-12: 1000 mg via ORAL
  Filled 2020-10-12: qty 2

## 2020-10-12 MED ORDER — EPHEDRINE SULFATE-NACL 50-0.9 MG/10ML-% IV SOSY
PREFILLED_SYRINGE | INTRAVENOUS | Status: DC | PRN
  Administered 2020-10-12 (×3): 10 mg via INTRAVENOUS

## 2020-10-12 MED ORDER — ROCURONIUM BROMIDE 10 MG/ML (PF) SYRINGE
PREFILLED_SYRINGE | INTRAVENOUS | Status: AC
  Filled 2020-10-12: qty 10

## 2020-10-12 MED ORDER — MIDAZOLAM HCL 2 MG/2ML IJ SOLN
1.0000 mg | Freq: Once | INTRAMUSCULAR | Status: AC
  Filled 2020-10-12: qty 1

## 2020-10-12 MED ORDER — MIDAZOLAM HCL 2 MG/2ML IJ SOLN
INTRAMUSCULAR | Status: AC
  Administered 2020-10-12: 1 mg via INTRAVENOUS
  Filled 2020-10-12: qty 2

## 2020-10-12 MED ORDER — 0.9 % SODIUM CHLORIDE (POUR BTL) OPTIME
TOPICAL | Status: DC | PRN
  Administered 2020-10-12: 1000 mL

## 2020-10-12 MED ORDER — ROCURONIUM BROMIDE 10 MG/ML (PF) SYRINGE
PREFILLED_SYRINGE | INTRAVENOUS | Status: DC | PRN
  Administered 2020-10-12: 60 mg via INTRAVENOUS

## 2020-10-12 MED ORDER — DEXAMETHASONE SODIUM PHOSPHATE 10 MG/ML IJ SOLN
INTRAMUSCULAR | Status: DC | PRN
  Administered 2020-10-12: 4 mg via INTRAVENOUS

## 2020-10-12 SURGICAL SUPPLY — 75 items
AID PSTN UNV HD RSTRNT DISP (MISCELLANEOUS) ×1
BIT DRILL 5/64X5 DISP (BIT) ×2 IMPLANT
BIT DRILL FLUTED 3.0 STRL (BIT) ×1 IMPLANT
BLADE SAG 18X100X1.27 (BLADE) ×2 IMPLANT
BSPLAT GLND +2X24 MDLR (Joint) ×1 IMPLANT
CLSR STERI-STRIP ANTIMIC 1/2X4 (GAUZE/BANDAGES/DRESSINGS) ×1 IMPLANT
COVER SURGICAL LIGHT HANDLE (MISCELLANEOUS) ×2 IMPLANT
COVER WAND RF STERILE (DRAPES) ×2 IMPLANT
CUP SUT UNIV REVERS 39+2 LT (Shoulder) ×1 IMPLANT
DRAPE IMP U-DRAPE 54X76 (DRAPES) ×4 IMPLANT
DRAPE INCISE IOBAN 66X45 STRL (DRAPES) ×2 IMPLANT
DRAPE ORTHO SPLIT 77X108 STRL (DRAPES) ×4
DRAPE SURG 17X23 STRL (DRAPES) ×2 IMPLANT
DRAPE SURG ORHT 6 SPLT 77X108 (DRAPES) ×2 IMPLANT
DRAPE U-SHAPE 47X51 STRL (DRAPES) ×2 IMPLANT
DRESSING AQUACEL AG SP 3.5X6 (GAUZE/BANDAGES/DRESSINGS) ×1 IMPLANT
DRSG AQUACEL AG ADV 3.5X 6 (GAUZE/BANDAGES/DRESSINGS) ×1 IMPLANT
DRSG AQUACEL AG ADV 3.5X10 (GAUZE/BANDAGES/DRESSINGS) ×2 IMPLANT
DRSG AQUACEL AG SP 3.5X6 (GAUZE/BANDAGES/DRESSINGS) ×2
DURAPREP 26ML APPLICATOR (WOUND CARE) ×2 IMPLANT
ELECT BLADE 4.0 EZ CLEAN MEGAD (MISCELLANEOUS) ×2
ELECT REM PT RETURN 9FT ADLT (ELECTROSURGICAL) ×2
ELECTRODE BLDE 4.0 EZ CLN MEGD (MISCELLANEOUS) ×1 IMPLANT
ELECTRODE REM PT RTRN 9FT ADLT (ELECTROSURGICAL) ×1 IMPLANT
GLENOID UNI REV MOD 24 +2 LAT (Joint) ×1 IMPLANT
GLENOSPHERE 39+4 LAT/24 UNI RV (Joint) ×1 IMPLANT
GLOVE BIO SURGEON STRL SZ7.5 (GLOVE) ×4 IMPLANT
GLOVE BIOGEL PI IND STRL 8 (GLOVE) ×2 IMPLANT
GLOVE BIOGEL PI INDICATOR 8 (GLOVE) ×2
GOWN STRL REUS W/ TWL LRG LVL3 (GOWN DISPOSABLE) ×1 IMPLANT
GOWN STRL REUS W/ TWL XL LVL3 (GOWN DISPOSABLE) ×2 IMPLANT
GOWN STRL REUS W/TWL LRG LVL3 (GOWN DISPOSABLE) ×2
GOWN STRL REUS W/TWL XL LVL3 (GOWN DISPOSABLE) ×4
INSERT HUMERAL 39/+6 (Insert) ×1 IMPLANT
KIT BASIN OR (CUSTOM PROCEDURE TRAY) ×2 IMPLANT
KIT TURNOVER KIT B (KITS) ×2 IMPLANT
MANIFOLD NEPTUNE II (INSTRUMENTS) ×2 IMPLANT
NDL 1/2 CIR MAYO (NEEDLE) ×1 IMPLANT
NDL HYPO 25GX1X1/2 BEV (NEEDLE) ×1 IMPLANT
NEEDLE 1/2 CIR MAYO (NEEDLE) ×2 IMPLANT
NEEDLE HYPO 25GX1X1/2 BEV (NEEDLE) ×2 IMPLANT
NS IRRIG 1000ML POUR BTL (IV SOLUTION) ×2 IMPLANT
PACK SHOULDER (CUSTOM PROCEDURE TRAY) ×2 IMPLANT
PAD ARMBOARD 7.5X6 YLW CONV (MISCELLANEOUS) ×4 IMPLANT
RESTRAINT HEAD UNIVERSAL NS (MISCELLANEOUS) ×2 IMPLANT
SCREW CENTRAL MOD 30MM (Screw) ×1 IMPLANT
SCREW PERI LOCK 5.5X16 (Screw) ×1 IMPLANT
SCREW PERI LOCK 5.5X24 (Screw) ×1 IMPLANT
SCREW PERIPHERAL 5.5X28 LOCK (Screw) ×2 IMPLANT
SET PIN UNIVERSAL REVERSE (SET/KITS/TRAYS/PACK) ×1 IMPLANT
SLING ARM IMMOBILIZER LRG (SOFTGOODS) IMPLANT
SLING ARM IMMOBILIZER MED (SOFTGOODS) IMPLANT
SPONGE LAP 18X18 RF (DISPOSABLE) IMPLANT
SPONGE LAP 4X18 RFD (DISPOSABLE) ×2 IMPLANT
STEM HUM UNIV REV SZ 10 (Stem) ×1 IMPLANT
STRIP CLOSURE SKIN 1/2X4 (GAUZE/BANDAGES/DRESSINGS) ×2 IMPLANT
SUCTION FRAZIER HANDLE 10FR (MISCELLANEOUS) ×2
SUCTION TUBE FRAZIER 10FR DISP (MISCELLANEOUS) ×1 IMPLANT
SUT FIBERWIRE #2 38 T-5 BLUE (SUTURE) ×2
SUT MNCRL AB 4-0 PS2 18 (SUTURE) ×2 IMPLANT
SUT MON AB 3-0 SH 27 (SUTURE) ×2
SUT MON AB 3-0 SH27 (SUTURE) IMPLANT
SUT VIC AB 0 CT1 27 (SUTURE) ×2
SUT VIC AB 0 CT1 27XBRD ANBCTR (SUTURE) IMPLANT
SUT VIC AB 1 CT1 27 (SUTURE)
SUT VIC AB 1 CT1 27XBRD ANBCTR (SUTURE) IMPLANT
SUT VIC AB 2-0 CT1 27 (SUTURE) ×4
SUT VIC AB 2-0 CT1 TAPERPNT 27 (SUTURE) ×1 IMPLANT
SUTURE FIBERWR #2 38 T-5 BLUE (SUTURE) ×1 IMPLANT
SYR CONTROL 10ML LL (SYRINGE) ×2 IMPLANT
TOWEL GREEN STERILE (TOWEL DISPOSABLE) ×2 IMPLANT
TOWEL GREEN STERILE FF (TOWEL DISPOSABLE) ×2 IMPLANT
TOWER CARTRIDGE SMART MIX (DISPOSABLE) IMPLANT
WATER STERILE IRR 1000ML POUR (IV SOLUTION) ×2 IMPLANT
YANKAUER SUCT BULB TIP NO VENT (SUCTIONS) ×2 IMPLANT

## 2020-10-12 NOTE — Discharge Instructions (Addendum)
-   Maintain postoperative bandage until your follow-up appointment.  This bandage is waterproof and you may take a shower with this in place.  However, do not submerge underwater such as in a bathtub.  -No lifting with your left arm.  You may remove your sling and do some gentle activity with the hand, wrist, elbow as tolerated.  No lifting through the shoulder.  Otherwise, you may remove the sling for showering and getting dressed and other activities of daily living.  -We will maintain your sling until your first postoperative appointment with Dr. Aundria Rud.  -Apply ice liberally to the left shoulder for 20 to 30 minutes out of each hour that you are awake.  Do this around the clock as often as possible.  -For mild to moderate pain use Tylenol and Advil alternating around-the-clock.  For breakthrough pain use oxycodone as necessary.  -We will return to see Dr. Aundria Rud. In 2 weeks for routine postoperative care.

## 2020-10-12 NOTE — Transfer of Care (Signed)
Immediate Anesthesia Transfer of Care Note  Patient: Eric Travis  Procedure(s) Performed: REVERSE SHOULDER ARTHROPLASTY (Left Shoulder)  Patient Location: PACU  Anesthesia Type:GA combined with regional for post-op pain  Level of Consciousness: drowsy and patient cooperative  Airway & Oxygen Therapy: Patient Spontanous Breathing and Patient connected to nasal cannula oxygen  Post-op Assessment: Report given to RN, Post -op Vital signs reviewed and stable and Patient moving all extremities  Post vital signs: Reviewed and stable  Last Vitals:  Vitals Value Taken Time  BP 143/79 10/12/20 1500  Temp 36.2 C 10/12/20 1500  Pulse 84 10/12/20 1501  Resp 18 10/12/20 1501  SpO2 100 % 10/12/20 1501  Vitals shown include unvalidated device data.  Last Pain:  Vitals:   10/12/20 1109  TempSrc:   PainSc: 0-No pain         Complications: No complications documented.

## 2020-10-12 NOTE — H&P (Signed)
ORTHOPAEDIC H and P  REQUESTING PHYSICIAN: Yolonda Kida, MD  PCP:  Teodoro Spray, MD  Chief Complaint: Left rotator cuff arthropathy  HPI: Eric Travis is a 65 y.o. male who complains of left shoulder pain and dysfunction.  Is here today for reverse arthroplasty for the left shoulder.  Previous history of rotator cuff repair with failure of that repair over the course of a decade.  He is here today for definitive management.  Past Medical History:  Diagnosis Date  . Arthritis   . Dyslipidemia   . ED (erectile dysfunction)   . GERD (gastroesophageal reflux disease)   . History of acute prostatitis 2011  . Hypogonadism male   . Moderate persistent asthma    pulmonologist-  dr j. Janee Morn @ [facility] in [place], (seasonial asthma)  . OSA (obstructive sleep apnea)    per pt cpap intolerant  . Right shoulder pain   . Seasonal allergic rhinitis   . Vitamin D deficiency    Past Surgical History:  Procedure Laterality Date  . CARDIAC CATHETERIZATION  05-31-2016   @[facility]    normal coronary arteries,  normal EDP  . FINGER FRACTURE SURGERY  1985   pinning right little finger  . HEMORRHOID SURGERY    . KNEE ARTHROSCOPY    . REPLACEMENT UNICONDYLAR JOINT KNEE Bilateral right 2016 approx;  left 10-24-2010  dr 10-26-2010 @[facility]   . ROTATOR CUFF REPAIR  x4  left;  x3  right  . SHOULDER ARTHROSCOPY WITH ROTATOR CUFF REPAIR Right 04/22/2019   Procedure: SHOULDER ARTHROSCOPY WITH ROTATOR CUFF REPAIR AND ACROMIOPLASTY;  Surgeon: , MD;  Location: [facility];  Service: Orthopedics;  Laterality: Right;  intersclene block   Social History   Socioeconomic History  . Marital status: Married    Spouse name: Not on file  . Number of children: Not on file  . Years of education: Not on file  . Highest education level: Not on file  Occupational History  . Not on file  Tobacco Use  . Smoking status: Never Smoker  . Smokeless tobacco: Never Used  Vaping Use  .  Vaping Use: Never used  Substance and Sexual Activity  . Alcohol use: Yes    Comment: occasional  . Drug use: Never  . Sexual activity: Not on file  Other Topics Concern  . Not on file  Social History Narrative  . Not on file   Social Determinants of Health   Financial Resource Strain: Not on file  Food Insecurity: Not on file  Transportation Needs: Not on file  Physical Activity: Not on file  Stress: Not on file  Social Connections: Not on file   History reviewed. No pertinent family history. Allergies  Allergen Reactions  . Codeine Nausea Only   Prior to Admission medications   Medication Sig Start Date End Date Taking? Authorizing Provider  ALPRAZolam Eugenia Mcalpine) 0.5 MG tablet Take 0.5 mg by mouth at bedtime as needed for sleep.   Yes [provider]  Ascorbic Acid (VITA-C PO) Take 500 mg by mouth daily.   Yes [provider]  ergocalciferol (VITAMIN D2) 1.25 MG (50000 UT) capsule Take 50,000 Units by mouth once a week.   Yes [provider]  ezetimibe (ZETIA) 10 MG tablet Take 10 mg by mouth daily. 06/29/20  Yes [provider]  Methylcobalamin (B12-ACTIVE PO) Take 1,000 mcg by mouth daily.   Yes [provider]  omeprazole (PRILOSEC) 20 MG capsule Take 20 mg by  mouth daily.   Yes [provider]  pravastatin (PRAVACHOL) 20 MG tablet Take 20 mg by mouth at bedtime. 06/30/20  Yes [provider]  Testosterone Cypionate 200 MG/ML SOLN Inject 0.5 mLs as directed once a week.   Yes [provider]  zinc gluconate 50 MG tablet Take 50 mg by mouth daily.   Yes [provider]  ibuprofen (ADVIL) 200 MG tablet Take 800 mg by mouth every 6 (six) hours as needed for moderate pain.    [provider]  montelukast (SINGULAIR) 10 MG tablet Take 10 mg by mouth at bedtime.     [provider]   No results found.  Positive ROS: All other systems have been reviewed and were otherwise negative with  the exception of those mentioned in the HPI and as above.  Physical Exam: General: Alert, no acute distress Cardiovascular: No pedal edema Respiratory: No cyanosis, no use of accessory musculature GI: No organomegaly, abdomen is soft and non-tender Skin: No lesions in the area of chief complaint Neurologic: Sensation intact distally Psychiatric: Patient is competent for consent with normal mood and affect Lymphatic: No axillary or cervical lymphadenopathy  MUSCULOSKELETAL:  Left shoulder is clean, dry and intact.  Distally neurovascularly intact.  No open wounds.  Assessment: Left shoulder rotator cuff arthropathy  Plan: -Plan to proceed today with open reverse shoulder arthroplasty for the left shoulder.  We have previously discussed this in detail in the office.  He is here today for that definitive surgery.  -We discussed the risk of bleeding, infection, damage to surrounding nerves and structures, development of stiffness, fracture, dislocation, and the risk of anesthesia.  He has provided informed consent.  -We will plan for discharge home postoperatively from PACU assuming uneventful intraoperative course.    Yolonda Kida, MD Cell [phone]    10/12/2020 12:21 PM

## 2020-10-12 NOTE — Anesthesia Postprocedure Evaluation (Signed)
Anesthesia Post Note  Patient: Eric Travis  Procedure(s) Performed: REVERSE SHOULDER ARTHROPLASTY (Left Shoulder)     Patient location during evaluation: PACU Anesthesia Type: General Level of consciousness: awake and alert Pain management: pain level controlled Vital Signs Assessment: post-procedure vital signs reviewed and stable Respiratory status: spontaneous breathing, nonlabored ventilation, respiratory function stable and patient connected to nasal cannula oxygen Cardiovascular status: blood pressure returned to baseline and stable Postop Assessment: no apparent nausea or vomiting Anesthetic complications: no   No complications documented.  Last Vitals:  Vitals:   10/12/20 1515 10/12/20 1530  BP: 120/70 110/76  Pulse: 89 86  Resp: 18 15  Temp:  (!) 36.1 C  SpO2: 95% 94%    Last Pain:  Vitals:   10/12/20 1530  TempSrc:   PainSc: 0-No pain                 Labria Wos S

## 2020-10-12 NOTE — Anesthesia Procedure Notes (Signed)
Anesthesia Regional Block: Interscalene brachial plexus block   Pre-Anesthetic Checklist: ,, timeout performed, Correct Patient, Correct Site, Correct Laterality, Correct Procedure, Correct Position, site marked, Risks and benefits discussed, pre-op evaluation,  At surgeon's request and post-op pain management  Laterality: Left  Prep: Maximum Sterile Barrier Precautions used, chloraprep       Needles:  Injection technique: Single-shot  Needle Type: Echogenic Stimulator Needle     Needle Length: 9cm  Needle Gauge: 22     Additional Needles:   Procedures:,,,, ultrasound used (permanent image in chart),,,,  Narrative:  Start time: 10/12/2020 12:21 PM End time: 10/12/2020 12:24 PM Injection made incrementally with aspirations every 5 mL.  Performed by: Personally  Anesthesiologist: Kaylyn Layer, MD  Additional Notes: Risks, benefits, and alternative discussed. Patient gave consent for procedure. Patient prepped and draped in sterile fashion. Sedation administered, patient remains easily responsive to voice. Relevant anatomy identified with ultrasound guidance. Local anesthetic given in 5cc increments with no signs or symptoms of intravascular injection. No pain or paraesthesias with injection. Patient monitored throughout procedure with signs of LAST or immediate complications. Tolerated well. Ultrasound image placed in chart.  Eric Greenhouse, MD

## 2020-10-12 NOTE — Brief Op Note (Signed)
10/12/2020  2:28 PM  PATIENT:  Eric Travis  65 y.o. male  PRE-OPERATIVE DIAGNOSIS:  Left shoulder rotator cuff arthropathy  POST-OPERATIVE DIAGNOSIS:  Left shoulder rotator cuff arthropathy  PROCEDURE:  Procedure(s) with comments: REVERSE SHOULDER ARTHROPLASTY (Left) - 2.5 hrs  SURGEON:  Surgeon(s) and Role:    * Aundria Rud, Noah Delaine, MD - Primary  PHYSICIAN ASSISTANT:   ASSISTANTS: Dion Saucier, PA-C   ANESTHESIA:   regional and general  EBL:  300 mL   BLOOD ADMINISTERED:none  DRAINS: none   LOCAL MEDICATIONS USED:  MARCAINE     SPECIMEN:  No Specimen  DISPOSITION OF SPECIMEN:  N/A  COUNTS:  YES  TOURNIQUET:  * No tourniquets in log *  DICTATION: .Note written in EPIC  PLAN OF CARE: Discharge to home after PACU  PATIENT DISPOSITION:  PACU - hemodynamically stable.   Delay start of Pharmacological VTE agent (>24hrs) due to surgical blood loss or risk of bleeding: not applicable

## 2020-10-12 NOTE — Op Note (Signed)
10/12/2020  2:28 PM  PATIENT:  Eric Travis    PRE-OPERATIVE DIAGNOSIS:  Left shoulder rotator cuff arthropathy  POST-OPERATIVE DIAGNOSIS:  Same  PROCEDURE:  REVERSE SHOULDER ARTHROPLASTY  SURGEON:  Yolonda Kida, MD  ASSISTANT: Dion Saucier, PA-C  Assistant attestation:   PA Mcclung was utilized throughout the procedure for positioning the patient, approaching the shoulder joint, placing deep retractors and maintaining visualization, implantation of final shoulder implants as well as layered closure of wound and transported to PACU.  ANESTHESIA:   General  ESTIMATED BLOOD LOSS: 200cc  PREOPERATIVE INDICATIONS:  Eric Travis is a  65 y.o. male with a diagnosis of Left shoulder rotator cuff arthropathy who failed conservative measures and elected for surgical management.    The risks benefits and alternatives were discussed with the patient preoperatively including but not limited to the risks of infection, bleeding, nerve injury, cardiopulmonary complications, the need for revision surgery, dislocation, brachial plexus palsy, incomplete relief of pain, among others, and the patient was willing to proceed.  OPERATIVE IMPLANTS:  Arthrex reverse system Size 24+2 baseplate with a 30 mm central screw.  4 peripheral locking screws. 39+4 glenosphere Size 10 mini reverse humeral stem with standard humeral tray and a +6 polyethylene.  OPERATIVE FINDINGS:  This was a previously operated on left shoulder with open rotator cuff repair performed greater than 1 decade ago.  He had full-thickness and retracted superior rotator cuff tearing with very degenerative and poor tissue quality noted of the subscapularis, but this was intact.  The infraspinatus was torn anteriorly.  The teres minor was intact.  There was abundant subdeltoid and subacromial scarring noted.  He was also noted to have significant glenohumeral osteoarthritis with degenerative osteophytes noted on the humeral head,  with central full-thickness cartilage loss on the glenoid side.  Degenerative labral tearing noted 360 degree manner.  The biceps tendon was noted to be previously tenodesed in the intertubercular groove.  OPERATIVE PROCEDURE:  The patient was brought to the operating room and placed in the supine position. General anesthesia was administered. IV antibiotics were given. A Foley was NOT placed. Time out was performed. The upper extremity was prepped and draped in usual sterile fashion. The patient was in a beachchair position. Deltopectoral approach was carried out. The biceps was tenodesed to the pectoralis tendon with #2 Fiberwire. The subscapularis was released off of the bone.   I then performed circumferential releases of the humerus, and then dislocated the head, and then cut the humeral head at a 30 degree retroverted position using extra medullary cutting guide.  I wthen turned my attention to the glenoid.  Deep retractors were placed, and I resected the labrum, and then placed a guidepin into the center position on the glenoid, with slight inferior inclination. I then reamed over the guidepin, and this created a small metaphyseal cancellus blush inferiorly, removing just the cartilage to the subchondral bone superiorly. The base plate was selected and impacted place, and then I secured it centrally with a nonlocking screw, and I had excellent purchase both inferiorly and superiorly. I placed a short locking screws on anterior and posterior aspects.  I then turned my attention to the glenosphere, and impacted this into place.   The glenoid sphere was completely seated, and had engagement of the [facility] taper. I then turned my attention back to the humerus.  I sequentially broached, and then trialed, and was found to restore soft tissue tension, and it had 2 finger tightness. Therefore the  above named components were selected. The shoulder felt stable throughout functional motion.   I then  impacted the real prosthesis into place, as well as the real humeral tray, and reduced the shoulder. The shoulder had excellent motion, and was stable, and I irrigated the wounds copiously.   Ultimately, we did not really approximate the subscapularis to the humerus.  This was very poor quality tissue and thickened and scarred and significantly medial to the glenoid.  I then irrigated the shoulder copiously once more, we placed vancomycin powder deep in the wound, repaired the deltopectoral interval with Vicryl followed by subcutaneous Vicryl, then monocryl for the skin,  with Steri-Strips and sterile gauze for the skin. The patient was awakened and returned back in stable and satisfactory condition. There no complications and they tolerated the procedure well.  All counts were correct x2.  Disposition:  Mr. Cheryl Flash will be nonweightbearing to the left upper extremity.  He will be in his sling for the next 2 weeks.  He may remove the sling for activities of daily living, getting dressed, and bathing.  Otherwise we will begin the sling.  I will see him back in the office in 2 weeks.  The plan will be to discharge him from PACU today.

## 2020-10-12 NOTE — Anesthesia Procedure Notes (Signed)
Procedure Name: Intubation Date/Time: 10/12/2020 12:41 PM Performed by: Moshe Salisbury, CRNA Pre-anesthesia Checklist: Patient identified, Emergency Drugs available, Suction available and Patient being monitored Patient Re-evaluated:Patient Re-evaluated prior to induction Oxygen Delivery Method: Circle System Utilized Preoxygenation: Pre-oxygenation with 100% oxygen Induction Type: IV induction Ventilation: Mask ventilation without difficulty Laryngoscope Size: Mac and 4 Grade View: Grade II Tube type: Oral Tube size: 8.0 mm Number of attempts: 1 Airway Equipment and Method: Stylet Placement Confirmation: ETT inserted through vocal cords under direct vision,  positive ETCO2 and breath sounds checked- equal and bilateral Secured at: 23 cm Tube secured with: Tape Dental Injury: Teeth and Oropharynx as per pre-operative assessment

## 2020-10-17 ENCOUNTER — Encounter (HOSPITAL_COMMUNITY): Payer: Self-pay | Admitting: Orthopedic Surgery

## 2021-01-21 DIAGNOSIS — M6530 Trigger finger, unspecified finger: Secondary | ICD-10-CM | POA: Insufficient documentation

## 2021-06-20 DIAGNOSIS — E785 Hyperlipidemia, unspecified: Secondary | ICD-10-CM | POA: Insufficient documentation

## 2021-06-20 DIAGNOSIS — R221 Localized swelling, mass and lump, neck: Secondary | ICD-10-CM | POA: Insufficient documentation

## 2021-06-20 DIAGNOSIS — E291 Testicular hypofunction: Secondary | ICD-10-CM | POA: Insufficient documentation

## 2021-06-30 DIAGNOSIS — Z96612 Presence of left artificial shoulder joint: Secondary | ICD-10-CM | POA: Insufficient documentation

## 2021-09-27 DIAGNOSIS — M79641 Pain in right hand: Secondary | ICD-10-CM | POA: Insufficient documentation

## 2021-11-15 DIAGNOSIS — G5601 Carpal tunnel syndrome, right upper limb: Secondary | ICD-10-CM | POA: Insufficient documentation

## 2022-09-28 DIAGNOSIS — Z6827 Body mass index (BMI) 27.0-27.9, adult: Secondary | ICD-10-CM | POA: Insufficient documentation

## 2023-06-27 ENCOUNTER — Other Ambulatory Visit: Payer: Self-pay | Admitting: Orthopedic Surgery

## 2023-06-27 DIAGNOSIS — M25511 Pain in right shoulder: Secondary | ICD-10-CM

## 2023-07-09 ENCOUNTER — Ambulatory Visit
Admission: RE | Admit: 2023-07-09 | Discharge: 2023-07-09 | Disposition: A | Discharge status: Home / Self Care | Payer: BC Managed Care – PPO | Source: Ambulatory Visit | Attending: Orthopedic Surgery | Admitting: Orthopedic Surgery

## 2023-07-09 ENCOUNTER — Ambulatory Visit
Admission: RE | Admit: 2023-07-09 | Discharge: 2023-07-09 | Disposition: A | Discharge status: Home / Self Care | Payer: Medicare Other | Source: Ambulatory Visit | Attending: Orthopedic Surgery

## 2023-07-09 DIAGNOSIS — M25511 Pain in right shoulder: Secondary | ICD-10-CM

## 2023-07-09 MED ORDER — IOPAMIDOL (ISOVUE-M 200) INJECTION 41%
10.0000 mL | Freq: Once | INTRAMUSCULAR | Status: AC
  Administered 2023-07-09: 10 mL via INTRA_ARTICULAR

## 2024-01-18 DIAGNOSIS — R0609 Other forms of dyspnea: Secondary | ICD-10-CM | POA: Insufficient documentation

## 2024-02-04 DIAGNOSIS — Z9889 Other specified postprocedural states: Secondary | ICD-10-CM | POA: Insufficient documentation

## 2024-02-08 DIAGNOSIS — I2510 Atherosclerotic heart disease of native coronary artery without angina pectoris: Secondary | ICD-10-CM | POA: Insufficient documentation

## 2024-06-13 DIAGNOSIS — N5319 Other ejaculatory dysfunction: Secondary | ICD-10-CM | POA: Insufficient documentation

## 2024-06-24 ENCOUNTER — Other Ambulatory Visit: Payer: Self-pay | Admitting: Orthopedic Surgery

## 2024-06-24 DIAGNOSIS — M25511 Pain in right shoulder: Secondary | ICD-10-CM

## 2024-07-01 ENCOUNTER — Ambulatory Visit
Admission: RE | Admit: 2024-07-01 | Discharge: 2024-07-01 | Disposition: A | Discharge status: Home / Self Care | Source: Ambulatory Visit | Attending: Orthopedic Surgery | Admitting: Orthopedic Surgery

## 2024-07-01 DIAGNOSIS — M25511 Pain in right shoulder: Secondary | ICD-10-CM

## 2024-07-01 MED ORDER — IOPAMIDOL (ISOVUE-M 200) INJECTION 41%
12.0000 mL | Freq: Once | INTRAMUSCULAR | Status: AC
  Administered 2024-07-01: 12 mL via INTRA_ARTICULAR

## 2024-07-30 ENCOUNTER — Encounter: Payer: Self-pay | Admitting: Orthopedic Surgery

## 2024-07-31 DIAGNOSIS — I7121 Aneurysm of the ascending aorta, without rupture: Secondary | ICD-10-CM | POA: Insufficient documentation

## 2024-08-01 ENCOUNTER — Other Ambulatory Visit: Payer: Self-pay | Admitting: Orthopedic Surgery

## 2024-08-01 DIAGNOSIS — I7121 Aneurysm of the ascending aorta, without rupture: Secondary | ICD-10-CM

## 2024-08-07 ENCOUNTER — Ambulatory Visit
Admission: RE | Admit: 2024-08-07 | Discharge: 2024-08-07 | Disposition: A | Discharge status: Home / Self Care | Source: Ambulatory Visit | Attending: Orthopedic Surgery | Admitting: Orthopedic Surgery

## 2024-08-07 DIAGNOSIS — I7121 Aneurysm of the ascending aorta, without rupture: Secondary | ICD-10-CM

## 2024-08-07 MED ORDER — IOPAMIDOL (ISOVUE-370) INJECTION 76%
75.0000 mL | Freq: Once | INTRAVENOUS | Status: AC | PRN
  Administered 2024-08-07: 75 mL via INTRAVENOUS

## 2024-08-28 ENCOUNTER — Encounter

## 2024-08-29 ENCOUNTER — Encounter

## 2024-09-01 ENCOUNTER — Encounter
